# Patient Record
Sex: Female | Born: 1980
Health system: Southern US, Community
[De-identification: ages and names within clinical notes are randomized; demographics above are authoritative.]

## PROBLEM LIST (undated history)

## (undated) DIAGNOSIS — G4700 Insomnia, unspecified: Secondary | ICD-10-CM

## (undated) DIAGNOSIS — E559 Vitamin D deficiency, unspecified: Secondary | ICD-10-CM

## (undated) DIAGNOSIS — N3010 Interstitial cystitis (chronic) without hematuria: Secondary | ICD-10-CM

## (undated) DIAGNOSIS — G43909 Migraine, unspecified, not intractable, without status migrainosus: Secondary | ICD-10-CM

## (undated) DIAGNOSIS — N8003 Adenomyosis of the uterus: Secondary | ICD-10-CM

## (undated) DIAGNOSIS — E039 Hypothyroidism, unspecified: Secondary | ICD-10-CM

## (undated) DIAGNOSIS — N800 Endometriosis of uterus: Secondary | ICD-10-CM

## (undated) HISTORY — PX: CERVICAL CONE BIOPSY: SUR198

## (undated) HISTORY — DX: Endometriosis of uterus: N80.0

## (undated) HISTORY — DX: Migraine, unspecified, not intractable, without status migrainosus: G43.909

## (undated) HISTORY — DX: Hypothyroidism, unspecified: E03.9

## (undated) HISTORY — DX: Adenomyosis of the uterus: N80.03

## (undated) HISTORY — DX: Interstitial cystitis (chronic) without hematuria: N30.10

## (undated) HISTORY — DX: Vitamin D deficiency, unspecified: E55.9

## (undated) HISTORY — DX: Insomnia, unspecified: G47.00

---

## 2005-11-23 ENCOUNTER — Other Ambulatory Visit: Admission: RE | Admit: 2005-11-23 | Discharge: 2005-11-23 | Payer: Self-pay | Admitting: Gynecology

## 2016-12-12 ENCOUNTER — Encounter: Payer: Self-pay | Admitting: *Deleted

## 2016-12-13 ENCOUNTER — Encounter: Payer: Self-pay | Admitting: Diagnostic Neuroimaging

## 2016-12-13 ENCOUNTER — Ambulatory Visit (INDEPENDENT_AMBULATORY_CARE_PROVIDER_SITE_OTHER): Payer: Commercial Managed Care - PPO | Admitting: Diagnostic Neuroimaging

## 2016-12-13 ENCOUNTER — Encounter (INDEPENDENT_AMBULATORY_CARE_PROVIDER_SITE_OTHER): Payer: Self-pay

## 2016-12-13 VITALS — BP 125/90 | HR 85 | Ht 65.0 in | Wt 219.8 lb

## 2016-12-13 DIAGNOSIS — G43009 Migraine without aura, not intractable, without status migrainosus: Secondary | ICD-10-CM | POA: Diagnosis not present

## 2016-12-13 DIAGNOSIS — G252 Other specified forms of tremor: Secondary | ICD-10-CM | POA: Diagnosis not present

## 2016-12-13 DIAGNOSIS — G4440 Drug-induced headache, not elsewhere classified, not intractable: Secondary | ICD-10-CM | POA: Diagnosis not present

## 2016-12-13 DIAGNOSIS — R51 Headache: Secondary | ICD-10-CM | POA: Diagnosis not present

## 2016-12-13 DIAGNOSIS — T3995XA Adverse effect of unspecified nonopioid analgesic, antipyretic and antirheumatic, initial encounter: Secondary | ICD-10-CM

## 2016-12-13 DIAGNOSIS — R519 Headache, unspecified: Secondary | ICD-10-CM

## 2016-12-13 DIAGNOSIS — H538 Other visual disturbances: Secondary | ICD-10-CM

## 2016-12-13 MED ORDER — AMITRIPTYLINE HCL 25 MG PO TABS: 25.0000 mg | ORAL_TABLET | Freq: Every day | ORAL | 6 refills | Status: DC

## 2016-12-13 NOTE — Patient Instructions (Signed)
Thank you for coming to see [place] at [facility]. I hope we have been able to provide you high quality care today.  You may receive a patient satisfaction survey over the next few weeks. We would appreciate your feedback and comments so that we may continue to improve ourselves and the health of our patients.  - check MRI brain  - may consider lumbar puncture in future to measure opening pressure  - start amitriptyline '25mg'$  at bedtime  - continue maxalt, tylenol as needed   To prevent or relieve headaches, try the following:   Cool Compress. Lie down and place a cool compress on your head.   Avoid headache triggers. If certain foods or odors seem to have triggered your migraines in the past, avoid them. A headache diary might help you identify triggers.   Include physical activity in your daily routine.   Manage stress. Find healthy ways to cope with the stressors, such as delegating tasks on your to-do list.   Practice relaxation techniques. Try deep breathing, yoga, massage and visualization.   Eat regularly. Eating regularly scheduled meals and maintaining a healthy diet might help prevent headaches. Also, drink plenty of fluids.   Follow a regular sleep schedule. Sleep deprivation might contribute to headaches  Consider biofeedback. With this mind-body technique, you learn to control certain bodily functions - such as muscle tension, heart rate and blood pressure - to prevent headaches or reduce headache pain.    ~~~~~~~~~~~~~~~~~~~~~~~~~~~~~~~~~~~~~~~~~~~~~~~~~~~~~~~~~~~~~~~~~  DR. Destinie Thornsberry'S GUIDE TO HAPPY AND HEALTHY LIVING These are some of my general health and wellness recommendations. Some of them may apply to you better than others. Please use common sense as you try these suggestions and feel free to ask me any questions.   ACTIVITY/FITNESS Mental, social, emotional and physical stimulation are very important for brain and body health. Try  learning a new activity (arts, music, language, sports, games).  Keep moving your body to the best of your abilities. You can do this at home, inside or outside, the park, community center, gym or anywhere you like. Consider a physical therapist or personal trainer to get started. Consider the app Sworkit. Fitness trackers such as smart-watches, smart-phones or Fitbits can help as well.   NUTRITION Eat more plants: colorful vegetables, nuts, seeds and berries.  Eat less sugar, salt, preservatives and processed foods.  Avoid toxins such as cigarettes and alcohol.  Drink water when you are thirsty. Warm water with a slice of lemon is an excellent morning drink to start the day.  Consider these websites for more information The Nutrition Source (https://www.henry-hernandez.biz/) Precision Nutrition (WindowBlog.ch)   RELAXATION Consider practicing mindfulness meditation or other relaxation techniques such as deep breathing, prayer, yoga, tai chi, massage. See website mindful.org or the apps Headspace or Calm to help get started.   SLEEP Try to get at least 7-8+ hours sleep per day. Regular exercise and reduced caffeine will help you sleep better. Practice good sleep hygeine techniques. See website sleep.org for more information.   PLANNING Prepare estate planning, living will, healthcare POA documents. Sometimes this is best planned with the help of an attorney. Theconversationproject.org and agingwithdignity.org are excellent resources.

## 2016-12-13 NOTE — Progress Notes (Signed)
[facility]  PATIENT: Lauren Faulkner DOB: 1981-05-24  REFERRING CLINICIAN: Karrie Doffing, PA HISTORY FROM: patient  REASON FOR VISIT: new consult    HISTORICAL  CHIEF COMPLAINT:  Chief Complaint  Patient presents with  . NP  Lauren Faulkner  . Migraines    has vision changes, hand tremors/ lightheadedness goes away after 15 min.   glucose ok.    HISTORY OF PRESENT ILLNESS:   36 year old right-handed file here for evaluation of headaches, vision changes, tremors. Patient has history of migraine headaches since 36 years old. She describes right-sided pressure and pounding headaches with nausea, photophobia, phonophobia. Headaches improved around 2008, but worsened in 2015. No specific etiology for change in headache frequency or pattern. Triggering factors include process food and alcohol. Menstrual cycle seems to aggravate headaches. Patient has at least 3 days of menstrual migraine per month. She has 1-2 other migraine headaches per week. She also has mild daily headache. She uses Tylenol almost on daily basis. She is Maxalt as needed for rescue of migraine. She is tried Fioricet, Imitrex, Topamax without benefit.  In last 6 month patient having increasing changes with her vision. Last 2-4 months she has had fine tremor in her arms and hands.    REVIEW OF SYSTEMS: Full 14 system review of systems performed and negative with exception of: Blurred vision headache numbness dizziness tremor.  ALLERGIES: No Known Allergies  HOME MEDICATIONS: Outpatient Medications Prior to Visit  Medication Sig Dispense Refill  . ALPRAZolam (XANAX) 0.25 MG tablet Take 0.25 mg by mouth at bedtime as needed for anxiety.    . rizatriptan (MAXALT) 10 MG tablet Take 10 mg by mouth as needed for migraine. May repeat in 2 hours if needed    . levothyroxine (SYNTHROID, LEVOTHROID) 25 MCG tablet Take 25 mcg by mouth daily before breakfast.    . loratadine (CLARITIN) 10 MG tablet Take 10 mg by mouth  daily.    Lauren Faulkner Triphasic (ORTHO TRI-CYCLEN, 28, PO) Take 1 tablet by mouth daily.     No facility-administered medications prior to visit.     PAST MEDICAL HISTORY: Past Medical History:  Diagnosis Date  . Adenomyosis    fibroadenomoa R breast  . Hypothyroid   . Insomnia   . Interstitial cystitis   . Migraines   . Vitamin D deficiency     PAST SURGICAL HISTORY: Past Surgical History:  Procedure Laterality Date  . CERVICAL CONE BIOPSY      FAMILY HISTORY: Family History  Problem Relation Age of Onset  . Diabetes Mother   . Hyperlipidemia Mother   . Fibroids Mother   . Diabetes Father   . Hypertension Father   . Hyperlipidemia Father   . Ovarian cancer Maternal Grandmother   . Stroke Maternal Grandmother     SOCIAL HISTORY:  Social History   Social History  . Marital status: Married    Spouse name: N/A  . Number of children: N/A  . Years of education: N/A   Occupational History  . Not on file.   Social History Main Topics  . Smoking status: Former Smoker    Quit date: 06/05/2002  . Smokeless tobacco: Never Used  . Alcohol use Yes     Comment: occ  . Drug use: No  . Sexual activity: Not on file   Other Topics Concern  . Not on file   Social History Narrative   Pt is Engineer, civil (consulting), employed at [employer].  Married.  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  Caffeine  1 cup per day.       PHYSICAL EXAM  GENERAL EXAM/CONSTITUTIONAL: Vitals:  Vitals:   12/13/16 0845  BP: 125/90  Pulse: 85  Weight: 219 lb 12.8 oz (99.7 kg)  Height: 5\' 5"  (1.651 m)     Body mass index is 36.58 kg/m.  Visual Acuity Screening   Right eye Left eye Both eyes  Without correction: 20/40    With correction:  20/40      Patient is in no distress; well developed, nourished and groomed; neck is supple  CARDIOVASCULAR:  Examination of carotid arteries is normal; no carotid bruits  Regular rate and rhythm, no murmurs  Examination of peripheral vascular system by  observation and palpation is normal  EYES:  Ophthalmoscopic exam of optic discs and posterior segments is normal; no papilledema or hemorrhages  MUSCULOSKELETAL:  Gait, strength, tone, movements noted in Neurologic exam below  NEUROLOGIC: MENTAL STATUS:  No flowsheet data found.  awake, alert, oriented to person, place and time  recent and remote memory intact  normal attention and concentration  language fluent, comprehension intact, naming intact,   fund of knowledge appropriate  CRANIAL NERVE:   2nd - no papilledema on fundoscopic exam  2nd, 3rd, 4th, 6th - pupils equal and reactive to light, visual fields full to confrontation, extraocular muscles intact, no nystagmus  5th - facial sensation symmetric  7th - facial strength symmetric  8th - hearing intact  9th - palate elevates symmetrically, uvula midline  11th - shoulder shrug symmetric  12th - tongue protrusion midline  MOTOR:   FINE POSTURAL TREMOR IN BUE  normal bulk and tone, full strength in the BUE, BLE  SENSORY:   normal and symmetric to light touch, temperature, vibration  COORDINATION:   finger-nose-finger, fine finger movements normal  REFLEXES:   deep tendon reflexes present and symmetric  GAIT/STATION:   narrow based gait; able to walk tandem; romberg is negative    DIAGNOSTIC DATA (LABS, IMAGING, TESTING) - I reviewed patient records, labs, notes, testing and imaging myself where available.  No results found for: WBC, HGB, HCT, MCV, PLT No results found for: NA, K, CL, CO2, GLUCOSE, BUN, CREATININE, CALCIUM, PROT, ALBUMIN, AST, ALT, ALKPHOS, BILITOT, GFRNONAA, GFRAA No results found for: CHOL, HDL, LDLCALC, LDLDIRECT, TRIG, CHOLHDL No results found for: ZOXW9U No results found for: VITAMINB12 No results found for: TSH       ASSESSMENT AND PLAN  36 y.o. year old female here with history of migraine without aura since age 28 years old, with increasing headaches in the  past 3 years. Now with new onset vision changes and tremor. Will check additional workup and optimize migraine treatment.  Meds tried: tylenol (partially effective), maxalt (partially effective), fioricet (lightheaded), imitrex (lightheaded), topamax (lightheaded)   Dx:  1. Migraine without aura and without status migrainosus, not intractable   2. Chronic daily headache   3. Analgesic overuse headache   4. Postural tremor   5. Blurred vision       PLAN: - check MRI brain (w/wo) - rule out autoimmune, inflam, vascular, demyelinating dz - may consider lumbar puncture in future to measure opening pressure - start amitriptyline 25mg  at bedtime - continue maxalt, tylenol as needed  Orders Placed This Encounter  Procedures  . MR BRAIN W WO CONTRAST   Meds ordered this encounter  Medications  . amitriptyline (ELAVIL) 25 MG tablet    Sig: Take 1 tablet (25 mg total) by mouth at bedtime.  Dispense:  30 tablet    Refill:  6   Return in about 3 months (around 03/15/2017).    Suanne MarkerVIKRAM R. Tyrel Lex, MD 12/13/2016, 9:20 AM Certified in Neurology, Neurophysiology and Neuroimaging  [facility] [address], [address] [place], [place] [address] [phone]

## 2017-03-12 ENCOUNTER — Encounter: Payer: Self-pay | Admitting: Diagnostic Neuroimaging

## 2017-03-28 ENCOUNTER — Ambulatory Visit: Payer: Commercial Managed Care - PPO | Admitting: Diagnostic Neuroimaging

## 2019-07-28 ENCOUNTER — Telehealth: Payer: Self-pay

## 2019-07-28 NOTE — Telephone Encounter (Signed)
Patient called and stated that she has finished her ABX and now another abcess has formed.  She would like to know if she could get another round of ABX and diflucan sent to [facility].   Please advise.

## 2019-07-29 ENCOUNTER — Other Ambulatory Visit: Payer: Self-pay | Admitting: Family Medicine

## 2019-07-29 MED ORDER — FLUCONAZOLE 150 MG PO TABS: 150.0000 mg | ORAL_TABLET | Freq: Once | ORAL | 0 refills | Status: AC

## 2019-07-29 MED ORDER — CEPHALEXIN 500 MG PO CAPS: 500.0000 mg | ORAL_CAPSULE | Freq: Two times a day (BID) | ORAL | 0 refills | Status: DC

## 2019-07-29 NOTE — Progress Notes (Signed)
I will send keflex and diflucan, but if does not resolve or if recurs she will need an appointment. kc

## 2019-10-17 ENCOUNTER — Ambulatory Visit (INDEPENDENT_AMBULATORY_CARE_PROVIDER_SITE_OTHER): Payer: Managed Care, Other (non HMO) | Admitting: Family Medicine

## 2019-10-17 DIAGNOSIS — J301 Allergic rhinitis due to pollen: Secondary | ICD-10-CM

## 2019-10-17 NOTE — Progress Notes (Signed)
Not seen

## 2020-02-06 ENCOUNTER — Ambulatory Visit: Payer: Managed Care, Other (non HMO) | Admitting: Family Medicine

## 2020-02-06 ENCOUNTER — Other Ambulatory Visit: Payer: Self-pay

## 2020-02-06 ENCOUNTER — Encounter: Payer: Self-pay | Admitting: Family Medicine

## 2020-02-06 VITALS — BP 136/76 | HR 98 | Temp 97.8°F | Ht 67.0 in | Wt 247.0 lb

## 2020-02-06 DIAGNOSIS — F331 Major depressive disorder, recurrent, moderate: Secondary | ICD-10-CM

## 2020-02-06 MED ORDER — ESCITALOPRAM OXALATE 10 MG PO TABS: 10.0000 mg | ORAL_TABLET | Freq: Every day | ORAL | 0 refills | Status: DC

## 2020-02-06 NOTE — Patient Instructions (Signed)
Start on lexapro 10 mg once daily at night  Major Depressive Disorder, Adult Major depressive disorder (MDD) is a mental health condition. MDD often makes you feel sad, hopeless, or helpless. MDD can also cause symptoms in your body. MDD can affect your:  Work.  School.  Relationships.  Other normal activities. MDD can range from mild to very bad. It may occur once (single episode MDD). It can also occur many times (recurrent MDD). The main symptoms of MDD often include:  Feeling sad, depressed, or irritable most of the time.  Loss of interest. MDD symptoms also include:  Sleeping too much or too little.  Eating too much or too little.  A change in your weight.  Feeling tired (fatigue) or having low energy.  Feeling worthless.  Feeling guilty.  Trouble making decisions.  Trouble thinking clearly.  Thoughts of suicide or harming others.  Feeling weak.  Feeling agitated.  Keeping yourself from being around other people (isolation). Follow these instructions at home: Activity  Do these things as told by your doctor: ? Go back to your normal activities. ? Exercise regularly. ? Spend time outdoors. Alcohol  Talk with your doctor about how alcohol can affect your antidepressant medicines.  Do not drink alcohol. Or, limit how much alcohol you drink. ? This means no more than 1 drink a day for nonpregnant women and 2 drinks a day for men. One drink equals one of these:  12 oz of beer.  5 oz of wine.  1 oz of hard liquor. General instructions  Take over-the-counter and prescription medicines only as told by your doctor.  Eat a healthy diet.  Get plenty of sleep.  Find activities that you enjoy. Make time to do them.  Think about joining a support group. Your doctor may be able to suggest a group for you.  Keep all follow-up visits as told by your doctor. This is important. Where to find more information:  The [employer] on Mental  Illness: ? www.nami.org  [place]. [employer] of Mental Health: ? http://www.maynard.net/  National Suicide Prevention Lifeline: ? 512 102 5959. This is free, 24-hour help. Contact a doctor if:  Your symptoms get worse.  You have new symptoms. Get help right away if:  You self-harm.  You see, hear, taste, smell, or feel things that are not present (hallucinate). If you ever feel like you may hurt yourself or others, or have thoughts about taking your own life, get help right away. You can go to your nearest emergency department or call:  Your local emergency services (911 in the [place]  A suicide crisis helpline, such as the National Suicide Prevention Lifeline: ? 725-547-9209. This is open 24 hours a day. This information is not intended to replace advice given to you by your health care provider. Make sure you discuss any questions you have with your health care provider. Document Revised: 05/04/2017 Document Reviewed: 02/06/2016 Elsevier Patient Education  2020 [employer].

## 2020-02-06 NOTE — Progress Notes (Signed)
Acute Office Visit  Subjective:    Patient ID: Lauren Faulkner, female    DOB: March 06, 1981, 39 y.o.   MRN: [id number]  Chief Complaint  Patient presents with  . Anxiety   HPI Patient is in today for stress and anxiety. One child just graduated high school, one starting high school, and a 37 yr old along with starting a new job. Live with boyfriend and father of 3rd child. Boyfriend has bipolar. Patient feels safe at home. Returned to [facility] in ED.  Previously took lexapro which made her sleepy. PHQ9 14. Denies suicidal ideation.  Past Medical History:  Diagnosis Date  . Adenomyosis    fibroadenomoa R breast  . Hypothyroid   . Insomnia   . Interstitial cystitis   . Migraines   . Vitamin D deficiency       Family History  Problem Relation Age of Onset  . Diabetes Mother   . Hyperlipidemia Mother   . Fibroids Mother   . Diabetes Father   . Hypertension Father   . Hyperlipidemia Father   . Ovarian cancer Maternal Grandmother   . Stroke Maternal Grandmother     Social History   Socioeconomic History  . Marital status: Married    Spouse name: Not on file  . Number of children: Not on file  . Years of education: Not on file  . Highest education level: Not on file  Occupational History  . Not on file  Tobacco Use  . Smoking status: Former Smoker    Quit date: 06/05/2002    Years since quitting: 17.6  . Smokeless tobacco: Never Used  Substance and Sexual Activity  . Alcohol use: Yes    Comment: occ  . Drug use: No  . Sexual activity: Not on file  Other Topics Concern  . 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   Caffeine  1 cup per day.     Social Determinants of Health   Financial Resource Strain:   . Difficulty of Paying Living Expenses: Not on file  Food Insecurity:   . Worried About Charity fundraiser in the Last Year: Not on file  . Ran Out of Food in the Last Year: Not on  file  Transportation Needs:   . Lack of Transportation (Medical): Not on file  . Lack of Transportation (Non-Medical): Not on file  Physical Activity:   . Days of Exercise per Week: Not on file  . Minutes of Exercise per Session: Not on file  Stress:   . Feeling of Stress : Not on file  Social Connections:   . Frequency of Communication with Friends and Family: Not on file  . Frequency of Social Gatherings with Friends and Family: Not on file  . Attends Religious Services: Not on file  . Active Member of Clubs or Organizations: Not on file  . Attends Archivist Meetings: Not on file  . Marital Status: Not on file  Intimate Partner Violence:   . Fear of Current or Ex-Partner: Not on file  . Emotionally Abused: Not on file  . Physically Abused: Not on file  . Sexually Abused: Not on file    Outpatient Medications Prior to Visit  Medication Sig Dispense Refill  . Multiple Vitamin (MULTIVITAMIN) tablet Take 1 tablet by mouth daily.    . cholecalciferol (VITAMIN D) 1000 units tablet Take 1,000 Units by mouth daily.    Marland Kitchen  rizatriptan (MAXALT) 10 MG tablet Take 10 mg by mouth as needed for migraine. May repeat in 2 hours if needed    . ALPRAZolam (XANAX) 0.25 MG tablet Take 0.25 mg by mouth at bedtime as needed for anxiety.    Marland Kitchen amitriptyline (ELAVIL) 25 MG tablet Take 1 tablet (25 mg total) by mouth at bedtime. 30 tablet 6  . cephALEXin (KEFLEX) 500 MG capsule Take 1 capsule (500 mg total) by mouth 2 (two) times daily. 20 capsule 0   No facility-administered medications prior to visit.    No Known Allergies  Review of Systems  Constitutional: Negative for chills, fatigue and fever.  HENT: Negative for congestion, ear pain and sore throat.   Respiratory: Negative for cough and shortness of breath.   Cardiovascular: Negative for chest pain.  Gastrointestinal: Negative for abdominal pain, constipation, diarrhea, nausea and vomiting.  Genitourinary: Negative for dysuria and  urgency.  Musculoskeletal: Negative for arthralgias and myalgias.  Skin: Negative for rash.  Neurological: Negative for dizziness and headaches.  Psychiatric/Behavioral: Positive for dysphoric mood. The patient is not nervous/anxious.        Objective:    Physical Exam Vitals reviewed.  Constitutional:      Appearance: Normal appearance. She is normal weight.  Cardiovascular:     Rate and Rhythm: Normal rate and regular rhythm.     Pulses: Normal pulses.     Heart sounds: Normal heart sounds.  Pulmonary:     Effort: Pulmonary effort is normal. No respiratory distress.     Breath sounds: Normal breath sounds.  Abdominal:     General: Abdomen is flat. Bowel sounds are normal.     Palpations: Abdomen is soft.     Tenderness: There is no abdominal tenderness.  Neurological:     Mental Status: She is alert and oriented to person, place, and time.  Psychiatric:        Mood and Affect: Mood normal.        Behavior: Behavior normal.     BP 136/76   Pulse 98   Temp 97.8 F (36.6 C)   Ht _0  (1.702 m)   Wt 247 lb (112 kg)   SpO2 97%   BMI 38.69 kg/m  Wt Readings from Last 3 Encounters:  02/06/20 247 lb (112 kg)  12/13/16 219 lb 12.8 oz (99.7 kg)    Health Maintenance Due  Topic Date Due  . Hepatitis C Screening  Never done  . COVID-19 Vaccine (1) Never done  . HIV Screening  Never done  . PAP SMEAR-Modifier  Never done  . INFLUENZA VACCINE  Never done    There are no preventive care reminders to display for this patient.   No results found for: TSH No results found for: WBC, HGB, HCT, MCV, PLT No results found for: NA, K, CHLORIDE, CO2, GLUCOSE, BUN, CREATININE, BILITOT, ALKPHOS, AST, ALT, PROT, ALBUMIN, CALCIUM, ANIONGAP, EGFR, GFR No results found for: CHOL No results found for: HDL No results found for: LDLCALC No results found for: TRIG No results found for: CHOLHDL No results found for: HGBA1C       Assessment & Plan:  1. Moderate recurrent major  depression (HCC) Start lexapro 10 mg once daily. Recommended counseling.  Recommended rest, exercise, and healthy eating.   Follow up in 3-4 weeks.    Rochel Brome, MD

## 2020-02-26 ENCOUNTER — Telehealth (INDEPENDENT_AMBULATORY_CARE_PROVIDER_SITE_OTHER): Payer: Managed Care, Other (non HMO) | Admitting: Family Medicine

## 2020-02-26 ENCOUNTER — Encounter: Payer: Self-pay | Admitting: Family Medicine

## 2020-02-26 VITALS — Wt 240.0 lb

## 2020-02-26 DIAGNOSIS — Z6837 Body mass index (BMI) 37.0-37.9, adult: Secondary | ICD-10-CM | POA: Diagnosis not present

## 2020-02-26 DIAGNOSIS — F330 Major depressive disorder, recurrent, mild: Secondary | ICD-10-CM | POA: Insufficient documentation

## 2020-02-26 DIAGNOSIS — E6609 Other obesity due to excess calories: Secondary | ICD-10-CM

## 2020-02-26 MED ORDER — ESCITALOPRAM OXALATE 10 MG PO TABS: 10.0000 mg | ORAL_TABLET | Freq: Every day | ORAL | 1 refills | Status: DC

## 2020-02-26 NOTE — Progress Notes (Signed)
Virtual Visit via Telephone Note   This visit type was conducted due to national recommendations for restrictions regarding the COVID-19 Pandemic (e.g. social distancing) in an effort to limit this patient's exposure and mitigate transmission in our community.  Due to her co-morbid illnesses, this patient is at least at moderate risk for complications without adequate follow up.  This format is felt to be most appropriate for this patient at this time.  The patient did not have access to video technology/had technical difficulties with video requiring transitioning to audio format only (telephone).  All issues noted in this document were discussed and addressed.  No physical exam could be performed with this format.  Patient verbally consented to a telehealth visit.   Date:  02/26/2020   ID:  Lauren Faulkner, DOB 09-Nov-1980, MRN [id number]  Patient Location: Home Provider Location: Office/Clinic  PCP:  Blane Ohara, MD   Evaluation Performed:  Follow-Up Visit  Chief Complaint:  DEPRESSION.  History of Present Illness:    Patient is in today for follow-up of depression and anxiety.  Previously patient had taken Lexapro and it made her sleepy however she was willing to try it again.  I restarted her at 10 mg once daily.  She feels is working well for her anxiety.  She denies depression or anhedonia.  Denies sleep issues.  Patient is working on eating healthier.  Denies suicidal ideation.  She is under a lot of stress have one child that just graduated high school, one just starting high school, and one a toddler with her  Boyfriend, who is bipolar.  She is also just returned to work.  She is coping well with these changes however since starting the Lexapro.. Patient states her current medication regimen is working and would like to continue it.  The patient does not have symptoms concerning for COVID-19 infection (fever, chills, cough, or new shortness of breath).    Past Medical History:    Diagnosis Date  . Adenomyosis    fibroadenomoa R breast  . Hypothyroid   . Insomnia   . Interstitial cystitis   . Migraines   . Vitamin D deficiency     Past Surgical History:  Procedure Laterality Date  . CERVICAL CONE BIOPSY      Family History  Problem Relation Age of Onset  . Diabetes Mother   . Hyperlipidemia Mother   . Fibroids Mother   . Diabetes Father   . Hypertension Father   . Hyperlipidemia Father   . Ovarian cancer Maternal Grandmother   . Stroke Maternal Grandmother     Social History   Socioeconomic History  . Marital status: Married    Spouse name: Not on file  . Number of children: Not on file  . Years of education: Not on file  . Highest education level: Not on file  Occupational History  . Not on file  Tobacco Use  . Smoking status: Former Smoker    Quit date: 06/05/2002    Years since quitting: 17.7  . Smokeless tobacco: Never Used  Substance and Sexual Activity  . Alcohol use: Yes    Comment: occ  . Drug use: No  . Sexual activity: Not on file  Other Topics Concern  . 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   Caffeine  1 cup per day.     Social Determinants of Health   Financial Resource Strain:   .  Difficulty of Paying Living Expenses: Not on file  Food Insecurity:   . Worried About Programme researcher, broadcasting/film/video in the Last Year: Not on file  . Ran Out of Food in the Last Year: Not on file  Transportation Needs:   . Lack of Transportation (Medical): Not on file  . Lack of Transportation (Non-Medical): Not on file  Physical Activity:   . Days of Exercise per Week: Not on file  . Minutes of Exercise per Session: Not on file  Stress:   . Feeling of Stress : Not on file  Social Connections:   . Frequency of Communication with Friends and Family: Not on file  . Frequency of Social Gatherings with Friends and Family: Not on file  . Attends Religious Services: Not on file   . Active Member of Clubs or Organizations: Not on file  . Attends Banker Meetings: Not on file  . Marital Status: Not on file  Intimate Partner Violence:   . Fear of Current or Ex-Partner: Not on file  . Emotionally Abused: Not on file  . Physically Abused: Not on file  . Sexually Abused: Not on file    Outpatient Medications Prior to Visit  Medication Sig Dispense Refill  . cholecalciferol (VITAMIN D) 1000 units tablet Take 1,000 Units by mouth daily.    Marland Kitchen escitalopram (LEXAPRO) 10 MG tablet Take 1 tablet (10 mg total) by mouth daily. 30 tablet 0  . Multiple Vitamin (MULTIVITAMIN) tablet Take 1 tablet by mouth daily.    . rizatriptan (MAXALT) 10 MG tablet Take 10 mg by mouth as needed for migraine. May repeat in 2 hours if needed     No facility-administered medications prior to visit.   .med Allergies:   Patient has no known allergies.   Social History   Tobacco Use  . Smoking status: Former Smoker    Quit date: 06/05/2002    Years since quitting: 17.7  . Smokeless tobacco: Never Used  Substance Use Topics  . Alcohol use: Yes    Comment: occ  . Drug use: No     Review of Systems  Constitutional: Negative for chills, fever and malaise/fatigue.  HENT: Negative for ear pain, sinus pain and sore throat.   Respiratory: Negative for cough and shortness of breath.   Cardiovascular: Negative for chest pain.  Musculoskeletal: Negative for myalgias.  Neurological: Negative for headaches.  Psychiatric/Behavioral: The patient is not nervous/anxious (Has helped).     Labs/Other Tests and Data Reviewed:    Recent Labs: No results found for requested labs within last 8760 hours.   Recent Lipid Panel No results found for: CHOL, TRIG, HDL, CHOLHDL, LDLCALC, LDLDIRECT  Wt Readings from Last 3 Encounters:  02/06/20 247 lb (112 kg)  12/13/16 219 lb 12.8 oz (99.7 kg)     Objective:    Vital Signs:  There were no vitals taken for this visit.   Physical Exam    ASSESSMENT & PLAN:   1. Mild recurrent major depression (HCC) The current medical regimen is effective;  continue present plan and medications. Rx. lexapro sent.  2. Obesity. bmi 37. Encouraged diet and exercise.    COVID-19 Education: The signs and symptoms of COVID-19 were discussed with the patient and how to seek care for testing (follow up with PCP or arrange E-visit). The importance of social distancing was discussed today.  Time:   Today, I have spent 10 minutes with the patient with telehealth technology discussing the above problems.  Follow Up:  In Person in 6 month(s)  Signed, Blane Ohara, MD  02/26/2020 1:53 PM    [facility]

## 2020-03-12 ENCOUNTER — Other Ambulatory Visit: Payer: Self-pay | Admitting: Family Medicine

## 2020-04-16 ENCOUNTER — Other Ambulatory Visit: Payer: Self-pay | Admitting: Family Medicine

## 2020-04-28 ENCOUNTER — Other Ambulatory Visit: Payer: Self-pay

## 2020-04-28 MED ORDER — MEDROXYPROGESTERONE ACETATE 150 MG/ML IM SUSY
PREFILLED_SYRINGE | INTRAMUSCULAR | 0 refills | Status: DC
Start: 2020-04-28 — End: 2020-09-13

## 2020-06-20 ENCOUNTER — Telehealth: Payer: Self-pay

## 2020-06-20 NOTE — Telephone Encounter (Signed)
LM to RS apt. I asked for the pt to call the office back. Office will be closed on Monday January 17 due to inclement weather. 

## 2020-06-21 ENCOUNTER — Encounter: Payer: Managed Care, Other (non HMO) | Admitting: Family Medicine

## 2020-07-08 ENCOUNTER — Encounter: Payer: Self-pay | Admitting: Nurse Practitioner

## 2020-07-08 ENCOUNTER — Ambulatory Visit (INDEPENDENT_AMBULATORY_CARE_PROVIDER_SITE_OTHER): Payer: No Typology Code available for payment source | Admitting: Nurse Practitioner

## 2020-07-08 ENCOUNTER — Other Ambulatory Visit: Payer: Self-pay

## 2020-07-08 VITALS — BP 128/72 | HR 102 | Temp 98.2°F | Ht 67.0 in | Wt 248.0 lb

## 2020-07-08 DIAGNOSIS — R5383 Other fatigue: Secondary | ICD-10-CM

## 2020-07-08 DIAGNOSIS — Z0000 Encounter for general adult medical examination without abnormal findings: Secondary | ICD-10-CM

## 2020-07-08 DIAGNOSIS — Z6838 Body mass index (BMI) 38.0-38.9, adult: Secondary | ICD-10-CM | POA: Diagnosis not present

## 2020-07-08 NOTE — Progress Notes (Signed)
Subjective:  Patient ID: Lauren Faulkner, female    DOB: [birth year]  Age: 40 y.o. MRN: [id number]  Chief Complaint  Patient presents with  . Annual Exam    With pap smear    HPI Well Adult Physical: Patient here for a comprehensive physical exam.The patient reports adult attention deficit disorder. Lauren Faulkner states she was diagnosed as a child and treated with prescription medication until early adulthood. She tells me she developed strategies to cope with inattentiveness. She states she was managing well until she had an unexpected pregnancy in 2020. 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 She states she has experienced difficulty concentrating or remembering appointments. Lauren Faulkner states she forgot her health insurance card today and is not fasting per instructions. She states she has struggled at work and at home completing tasks. Screening for adult ADHD (ASRS-vi.i) is positive. Education provided for medication management.  Do you take any herbs or supplements that were not prescribed by a doctor? no Are you taking calcium supplements? no Are you taking aspirin daily? no  Encounter for general adult medical examination without abnormal findings  Physical ("At Risk" items are starred): Patient's last physical exam was over 1 year ago .  Smoking: Life-long non-smoker ;  Physical Activity: She is not currently exercising Alcohol/Drug Use: Is a non-drinker ; No illicit drug use ;  Patient is not afflicted from Stress Incontinence and Urge Incontinence  Safety: reviewed. Patient wears a seat belt, has smoke detectors, has carbon monoxide detectors, practices appropriate gun safety, and wears sunscreen with extended sun exposure. Dental Care: biannual cleanings, brushes and flosses daily. Ophthalmology/Optometry:none  Hearing loss: none Vision impairments: none  Menarche: 40 years old Menstrual History:  Irregular LMP: Unknown, over 2 years Pregnancy history: 3 pregnancies and 3 vaginal births Safe at home: Yes Self breast exams: No     Flowsheet Row Video Visit from 02/26/2020 in [facility]  PHQ-2 Total Score 0              Social Hx   Social History   Socioeconomic History  . Marital status: Married    Spouse name: Not on file  . Number of children: Not on file  . Years of education: Not on file  . Highest education level: Not on file  Occupational History  . Not on file  Tobacco Use  . Smoking status: Former Smoker    Quit date: 06/05/2002    Years since quitting: 18.1  . Smokeless tobacco: Never Used  Substance and Sexual Activity  . Alcohol use: Yes    Comment: occ  . Drug use: No  . Sexual activity: Not on file  Other Topics Concern  . 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   Caffeine  1 cup per day.     Social Determinants of Health   Financial Resource Strain: Not on file  Food Insecurity: Not on file  Transportation Needs: Not on file  Physical Activity: Not on file  Stress: Not on file  Social Connections: Not on file   Past Medical History:  Diagnosis Date  . Adenomyosis    fibroadenomoa R breast  . Hypothyroid   . Insomnia   . Interstitial cystitis   . Migraines   . Vitamin D deficiency    Past Surgical History:  Procedure  Laterality Date  . CERVICAL CONE BIOPSY      Family History  Problem Relation Age of Onset  . Diabetes Mother   . Hyperlipidemia Mother   . Fibroids Mother   . Diabetes Father   . Hypertension Father   . Hyperlipidemia Father   . Ovarian cancer Maternal Grandmother   . Stroke Maternal Grandmother     Review of Systems  Constitutional: Negative for fatigue and fever.  HENT: Negative for congestion, ear pain, sinus pressure and sore throat.   Eyes: Negative for pain.  Respiratory: Negative for cough, chest tightness, shortness of  breath and wheezing.   Cardiovascular: Negative for chest pain and palpitations.  Gastrointestinal: Negative for abdominal pain, constipation, diarrhea, nausea and vomiting.  Genitourinary: Negative for dysuria and hematuria.  Musculoskeletal: Negative for arthralgias, back pain, joint swelling and myalgias.  Skin: Negative for rash.  Neurological: Negative for dizziness, weakness and headaches.  Psychiatric/Behavioral: Negative for dysphoric mood. The patient is not nervous/anxious.      Objective:  BP 128/72 (BP Location: Left Arm, Patient Position: Sitting)   Pulse (!) 102   Temp 98.2 F (36.8 C) (Temporal)   Ht 5\' 7"  (1.702 m)   Wt 248 lb (112.5 kg)   SpO2 99%   BMI 38.84 kg/m   BP/Weight 07/08/2020 02/26/2020 02/06/2020  Systolic BP 128 - 136  Diastolic BP 72 - 76  Wt. (Lbs) 248 240 247  BMI 38.84 37.59 38.69    Physical Exam Constitutional:      Appearance: Normal appearance.  HENT:     Right Ear: Tympanic membrane, ear canal and external ear normal.     Left Ear: Tympanic membrane, ear canal and external ear normal.     Nose: Nose normal.     Mouth/Throat:     Mouth: Mucous membranes are moist.  Cardiovascular:     Rate and Rhythm: Normal rate and regular rhythm.     Pulses: Normal pulses.     Heart sounds: Normal heart sounds.  Pulmonary:     Effort: Pulmonary effort is normal.     Breath sounds: Normal breath sounds.  Abdominal:     Palpations: Abdomen is soft.  Musculoskeletal:        General: Normal range of motion.     Cervical back: Normal range of motion.  Skin:    General: Skin is warm and dry.  Neurological:     Mental Status: She is oriented to person, place, and time.  Psychiatric:        Mood and Affect: Mood normal.        Behavior: Behavior normal.        Thought Content: Thought content normal.        Judgment: Judgment normal.       Assessment & Plan:   1. Physical exam - CBC with Differential/Platelet - Comprehensive metabolic  panel - TSH; Future - IGP, Aptima HPV, rfx 16/18,45  2. Fatigue, unspecified type - CBC with Differential/Platelet - Comprehensive metabolic panel - VITAMIN D 25 Hydroxy (Vit-D Deficiency, Fractures); Future - Lipid panel - TSH; Future - TSH - VITAMIN D 25 Hydroxy (Vit-D Deficiency, Fractures)  3. BMI 38.0-38.9,adult - Lipid panel - TSH; Future   Return for fasting labs next week We will call you with pap results Return in 20-months-telemedicine       Body mass index is 38.84 kg/m.   These are the goals we discussed: Goals   Increase physical activity  This is a list of the screening recommended for you and due dates:  Health Maintenance  Topic Date Due  .  Hepatitis C: One time screening is recommended by Center for Disease Control  (CDC) for  adults born from [birth year] through 1963.   Never done  . HIV Screening  Never done  . Pap Smear  Never done  . Flu Shot  Never done  . COVID-19 Vaccine (3 - Booster for Pfizer series) 11/10/2020  . Tetanus Vaccine  08/07/2028     AN INDIVIDUALIZED CARE PLAN: was established or reinforced today.   SELF MANAGEMENT: The patient and I together assessed ways to personally work towards obtaining the recommended goals  Support needs The patient and/or family needs were assessed and services were offered if appropriate.    Follow-up: 47-months  An After Visit Summary was printed and given to the patient.  Janie Morning, NP [facility] [phone]

## 2020-07-08 NOTE — Patient Instructions (Addendum)
Return for fasting labs next week We will call you with pap results Return in 68-months-telemedicine   Pap Test Why am I having this test? A Pap test, also called a Pap smear, is a screening test to check for signs of:  Cancer of the vagina, cervix, and uterus. The cervix is the lower part of the uterus that opens into the vagina.  Infection.  Changes that may be a sign that cancer is developing (precancerous changes). Women need this test on a regular basis. In general, you should have a Pap test every 3 years until you reach menopause or age 20. Women aged 30-60 may choose to have their Pap test done at the same time as an HPV (human papillomavirus) test every 5 years (instead of every 3 years). Your health care provider may recommend having Pap tests more or less often depending on your medical conditions and past Pap test results. What kind of sample is taken? Your health care provider will collect a sample of cells from the surface of your cervix. This will be done using a small cotton swab, plastic spatula, or brush. This sample is often collected during a pelvic exam, when you are lying on your back on an exam table with feet in footrests (stirrups). In some cases, fluids (secretions) from the cervix or vagina may also be collected.   How do I prepare for this test?  Be aware of where you are in your menstrual cycle. If you are menstruating on the day of the test, you may be asked to reschedule.  You may need to reschedule if you have a known vaginal infection on the day of the test.  Follow instructions from your health care provider about: ? Changing or stopping your regular medicines. Some medicines can cause abnormal test results, such as digitalis and tetracycline. ? Avoiding douching or taking a bath the day before or the day of the test. Tell a health care provider about:  Any allergies you have.  All medicines you are taking, including vitamins, herbs, eye drops, creams,  and over-the-counter medicines.  Any blood disorders you have.  Any surgeries you have had.  Any medical conditions you have.  Whether you are pregnant or may be pregnant. How are the results reported? Your test results will be reported as either abnormal or normal. A false-positive result can occur. A false positive is incorrect because it means that a condition is present when it is not. A false-negative result can occur. A false negative is incorrect because it means that a condition is not present when it is. What do the results mean? A normal test result means that you do not have signs of cancer of the vagina, cervix, or uterus. An abnormal result may mean that you have:  Cancer. A Pap test by itself is not enough to diagnose cancer. You will have more tests done in this case.  Precancerous changes in your vagina, cervix, or uterus.  Inflammation of the cervix.  An STD (sexually transmitted disease).  A fungal infection.  A parasite infection. Talk with your health care provider about what your results mean. Questions to ask your health care provider Ask your health care provider, or the department that is doing the test:  When will my results be ready?  How will I get my results?  What are my treatment options?  What other tests do I need?  What are my next steps? Summary  In general, women should have a Pap  test every 3 years until they reach menopause or age 73.  Your health care provider will collect a sample of cells from the surface of your cervix. This will be done using a small cotton swab, plastic spatula, or brush.  In some cases, fluids (secretions) from the cervix or vagina may also be collected. This information is not intended to replace advice given to you by your health care provider. Make sure you discuss any questions you have with your health care provider. Document Revised: 01/28/2020 Document Reviewed: 01/23/2020 Elsevier Patient Education   2021 [employer] Inc.  Health Maintenance, Female Adopting a healthy lifestyle and getting preventive care are important in promoting health and wellness. Ask your health care provider about:  The right schedule for you to have regular tests and exams.  Things you can do on your own to prevent diseases and keep yourself healthy. What should I know about diet, weight, and exercise? Eat a healthy diet  Eat a diet that includes plenty of vegetables, fruits, low-fat dairy products, and lean protein.  Do not eat a lot of foods that are high in solid fats, added sugars, or sodium.   Maintain a healthy weight Body mass index (BMI) is used to identify weight problems. It estimates body fat based on height and weight. Your health care provider can help determine your BMI and help you achieve or maintain a healthy weight. Get regular exercise Get regular exercise. This is one of the most important things you can do for your health. Most adults should:  Exercise for at least 150 minutes each week. The exercise should increase your heart rate and make you sweat (moderate-intensity exercise).  Do strengthening exercises at least twice a week. This is in addition to the moderate-intensity exercise.  Spend less time sitting. Even light physical activity can be beneficial. Watch cholesterol and blood lipids Have your blood tested for lipids and cholesterol at 38 years of age, then have this test every 5 years. Have your cholesterol levels checked more often if:  Your lipid or cholesterol levels are high.  You are older than 38 years of age.  You are at high risk for heart disease. What should I know about cancer screening? Depending on your health history and family history, you may need to have cancer screening at various ages. This may include screening for:  Breast cancer.  Cervical cancer.  Colorectal cancer.  Skin cancer.  Lung cancer. What should I know about heart disease,  diabetes, and high blood pressure? Blood pressure and heart disease  High blood pressure causes heart disease and increases the risk of stroke. This is more likely to develop in people who have high blood pressure readings, are of African descent, or are overweight.  Have your blood pressure checked: ? Every 3-5 years if you are 66-96 years of age. ? Every year if you are 93 years old or older. Diabetes Have regular diabetes screenings. This checks your fasting blood sugar level. Have the screening done:  Once every three years after age 83 if you are at a normal weight and have a low risk for diabetes.  More often and at a younger age if you are overweight or have a high risk for diabetes. What should I know about preventing infection? Hepatitis B If you have a higher risk for hepatitis B, you should be screened for this virus. Talk with your health care provider to find out if you are at risk for hepatitis B infection. Hepatitis  C Testing is recommended for:  Everyone born from [birth year] through 1963.  Anyone with known risk factors for hepatitis C. Sexually transmitted infections (STIs)  Get screened for STIs, including gonorrhea and chlamydia, if: ? You are sexually active and are younger than 38 years of age. ? You are older than 38 years of age and your health care provider tells you that you are at risk for this type of infection. ? Your sexual activity has changed since you were last screened, and you are at increased risk for chlamydia or gonorrhea. Ask your health care provider if you are at risk.  Ask your health care provider about whether you are at high risk for HIV. Your health care provider may recommend a prescription medicine to help prevent HIV infection. If you choose to take medicine to prevent HIV, you should first get tested for HIV. You should then be tested every 3 months for as long as you are taking the medicine. Pregnancy  If you are about to stop having your  period (premenopausal) and you may become pregnant, seek counseling before you get pregnant.  Take 400 to 800 micrograms (mcg) of folic acid every day if you become pregnant.  Ask for birth control (contraception) if you want to prevent pregnancy. Osteoporosis and menopause Osteoporosis is a disease in which the bones lose minerals and strength with aging. This can result in bone fractures. If you are 66 years old or older, or if you are at risk for osteoporosis and fractures, ask your health care provider if you should:  Be screened for bone loss.  Take a calcium or vitamin D supplement to lower your risk of fractures.  Be given hormone replacement therapy (HRT) to treat symptoms of menopause. Follow these instructions at home: Lifestyle  Do not use any products that contain nicotine or tobacco, such as cigarettes, e-cigarettes, and chewing tobacco. If you need help quitting, ask your health care provider.  Do not use street drugs.  Do not share needles.  Ask your health care provider for help if you need support or information about quitting drugs. Alcohol use  Do not drink alcohol if: ? Your health care provider tells you not to drink. ? You are pregnant, may be pregnant, or are planning to become pregnant.  If you drink alcohol: ? Limit how much you use to 0-1 drink a day. ? Limit intake if you are breastfeeding.  Be aware of how much alcohol is in your drink. In the [place] one drink equals one 12 oz bottle of beer (355 mL), one 5 oz glass of wine (148 mL), or one 1 oz glass of hard liquor (44 mL). General instructions  Schedule regular health, dental, and eye exams.  Stay current with your vaccines.  Tell your health care provider if: ? You often feel depressed. ? You have ever been abused or do not feel safe at home. Summary  Adopting a healthy lifestyle and getting preventive care are important in promoting health and wellness.  Follow your health care provider's  instructions about healthy diet, exercising, and getting tested or screened for diseases.  Follow your health care provider's instructions on monitoring your cholesterol and blood pressure. This information is not intended to replace advice given to you by your health care provider. Make sure you discuss any questions you have with your health care provider. Document Revised: 05/15/2018 Document Reviewed: 05/15/2018 Elsevier Patient Education  2021 [employer] 17-30 Years Old, Female Preventive  care refers to lifestyle choices and visits with your health care provider that can promote health and wellness. This includes:  A yearly physical exam. This is also called an annual wellness visit.  Regular dental and eye exams.  Immunizations.  Screening for certain conditions.  Healthy lifestyle choices, such as: ? Eating a healthy diet. ? Getting regular exercise. ? Not using drugs or products that contain nicotine and tobacco. ? Limiting alcohol use. What can I expect for my preventive care visit? Physical exam Your health care provider may check your:  Height and weight. These may be used to calculate your BMI (body mass index). BMI is a measurement that tells if you are at a healthy weight.  Heart rate and blood pressure.  Body temperature.  Skin for abnormal spots. Counseling Your health care provider may ask you questions about your:  Past medical problems.  Family's medical history.  Alcohol, tobacco, and drug use.  Emotional well-being.  Home life and relationship well-being.  Sexual activity.  Diet, exercise, and sleep habits.  Work and work Statistician.  Access to firearms.  Method of birth control.  Menstrual cycle.  Pregnancy history. What immunizations do I need? Vaccines are usually given at various ages, according to a schedule. Your health care provider will recommend vaccines for you based on your age, medical history, and  lifestyle or other factors, such as travel or where you work.   What tests do I need? Blood tests  Lipid and cholesterol levels. These may be checked every 5 years starting at age 22.  Hepatitis C test.  Hepatitis B test. Screening  Diabetes screening. This is done by checking your blood sugar (glucose) after you have not eaten for a while (fasting).  STD (sexually transmitted disease) testing, if you are at risk.  BRCA-related cancer screening. This may be done if you have a family history of breast, ovarian, tubal, or peritoneal cancers.  Pelvic exam and Pap test. This may be done every 3 years starting at age 88. Starting at age 12, this may be done every 5 years if you have a Pap test in combination with an HPV test. Talk with your health care provider about your test results, treatment options, and if necessary, the need for more tests.   Follow these instructions at home: Eating and drinking  Eat a healthy diet that includes fresh fruits and vegetables, whole grains, lean protein, and low-fat dairy products.  Take vitamin and mineral supplements as recommended by your health care provider.  Do not drink alcohol if: ? Your health care provider tells you not to drink. ? You are pregnant, may be pregnant, or are planning to become pregnant.  If you drink alcohol: ? Limit how much you have to 0-1 drink a day. ? Be aware of how much alcohol is in your drink. In the [place] one drink equals one 12 oz bottle of beer (355 mL), one 5 oz glass of wine (148 mL), or one 1 oz glass of hard liquor (44 mL).   Lifestyle  Take daily care of your teeth and gums. Brush your teeth every morning and night with fluoride toothpaste. Floss one time each day.  Stay active. Exercise for at least 30 minutes 5 or more days each week.  Do not use any products that contain nicotine or tobacco, such as cigarettes, e-cigarettes, and chewing tobacco. If you need help quitting, ask your health care  provider.  Do not use drugs.  If you are  sexually active, practice safe sex. Use a condom or other form of protection to prevent STIs (sexually transmitted infections).  If you do not wish to become pregnant, use a form of birth control. If you plan to become pregnant, see your health care provider for a prepregnancy visit.  Find healthy ways to cope with stress, such as: ? Meditation, yoga, or listening to music. ? Journaling. ? Talking to a trusted person. ? Spending time with friends and family. Safety  Always wear your seat belt while driving or riding in a vehicle.  Do not drive: ? If you have been drinking alcohol. Do not ride with someone who has been drinking. ? When you are tired or distracted. ? While texting.  Wear a helmet and other protective equipment during sports activities.  If you have firearms in your house, make sure you follow all gun safety procedures.  Seek help if you have been physically or sexually abused. What's next?  Go to your health care provider once a year for an annual wellness visit.  Ask your health care provider how often you should have your eyes and teeth checked.  Stay up to date on all vaccines. This information is not intended to replace advice given to you by your health care provider. Make sure you discuss any questions you have with your health care provider. Document Revised: 01/18/2020 Document Reviewed: 01/31/2018 Elsevier Patient Education  2021 [employer].  Vitamin D Deficiency Vitamin D deficiency is when your body does not have enough vitamin D. Vitamin D is important to your body because:  It helps your body use other minerals.  It helps to keep your bones strong and healthy.  It may help to prevent some diseases.  It helps your heart and other muscles work well. Not getting enough vitamin D can make your bones soft. It can also cause other health problems. What are the causes? This condition may be caused  by:  Not eating enough foods that contain vitamin D.  Not getting enough sun.  Having diseases that make it hard for your body to absorb vitamin D.  Having a surgery in which a part of the stomach or a part of the small intestine is removed.  Having kidney disease or liver disease. What increases the risk? You are more likely to get this condition if:  You are older.  You do not spend much time outdoors.  You live in a nursing home.  You have had broken bones.  You have weak or thin bones (osteoporosis).  You have a disease or condition that changes how your body absorbs vitamin D.  You have dark skin.  You take certain medicines.  You are overweight or obese. What are the signs or symptoms?  In mild cases, there may not be any symptoms. If the condition is very bad, symptoms may include: ? Bone pain. ? Muscle pain. ? Falling often. ? Broken bones caused by a minor injury. How is this treated? Treatment may include taking supplements as told by your doctor. Your doctor will tell you what dose is best for you. Supplements may include:  Vitamin D.  Calcium. Follow these instructions at home: Eating and drinking  Eat foods that contain vitamin D, such as: ? Dairy products, cereals, or juices with added vitamin D. Check the label. ? Fish, such as salmon or trout. ? Eggs. ? Oysters. ? Mushrooms. The items listed above may not be a complete list of what you can eat  and drink. Contact a dietitian for more options.   General instructions  Take medicines and supplements only as told by your doctor.  Get regular, safe exposure to natural sunlight.  Do not use a tanning bed.  Maintain a healthy weight. Lose weight if needed.  Keep all follow-up visits as told by your doctor. This is important. How is this prevented?  You can get vitamin D by: ? Eating foods that naturally contain vitamin D. ? Eating or drinking products that have vitamin D added to them, such  as cereals, juices, and milk. ? Taking vitamin D or a multivitamin that contains vitamin D. ? Being in the sun. Your body makes vitamin D when your skin is exposed to sunlight. Your body changes the sunlight into a form of the vitamin that it can use. Contact a doctor if:  Your symptoms do not go away.  You feel sick to your stomach (nauseous).  You throw up (vomit).  You poop less often than normal, or you have trouble pooping (constipation). Summary  Vitamin D deficiency is when your body does not have enough vitamin D.  Vitamin D helps to keep your bones strong and healthy.  This condition is often treated by taking a supplement.  Your doctor will tell you what dose is best for you. This information is not intended to replace advice given to you by your health care provider. Make sure you discuss any questions you have with your health care provider. Document Revised: 01/28/2018 Document Reviewed: 01/28/2018 Elsevier Patient Education  2021 [employer].      Attention Deficit Hyperactivity Disorder, Adult Attention deficit hyperactivity disorder (ADHD) is a mental health disorder that starts during childhood (neurodevelopmental disorder). For many people with ADHD, the disorder continues into the adult years. Treatment can help you manage your symptoms. What are the causes? The exact cause of ADHD is not known. Most experts believe genetics and environmental factors contribute to ADHD. What increases the risk? The following factors may make you more likely to develop this condition:  Having a family history of ADHD.  Being female.  Being born to a mother who smoked or drank alcohol during pregnancy.  Being exposed to lead or other toxins in the womb or early in life.  Being born before 69 weeks of pregnancy (prematurely) or at a low birth weight.  Having experienced a brain injury. What are the signs or symptoms? Symptoms of this condition depend on the type of  ADHD. The two main types are inattentive and hyperactive-impulsive. Some people may have symptoms of both types. Symptoms of the inattentive type include:  Difficulty paying attention.  Making careless mistakes.  Not following instructions.  Being disorganized.  Avoiding tasks that require time and attention.  Losing and forgetting things.  Being easily distracted. Symptoms of the hyperactive-impulsive type include:  Restlessness.  Talking too much.  Interrupting.  Difficulty with: ? Sitting still. ? Feeling motivated. ? Relaxing. ? Waiting in line or waiting for a turn. In adults, this condition may lead to certain problems, such as:  Keeping jobs.  Performing tasks at work.  Having stable relationships.  Being on time or keeping to a schedule. How is this diagnosed? This condition is diagnosed based on your current symptoms and your history of symptoms. The diagnosis can be made by a health care provider such as a primary care provider or a mental health care specialist. Your health care provider may use a symptom checklist or a behavior rating  scale to evaluate your symptoms. He or she may also want to talk with people who have observed your behaviors throughout your life. How is this treated? This condition can be treated with medicines and behavior therapy. Medicines may be the best option to reduce impulsive behaviors and improve attention. Your health care provider may recommend:  Stimulant medicines. These are the most common medicines used for adult ADHD. They affect certain chemicals in the brain (neurotransmitters) and improve your ability to control your symptoms.  A non-stimulant medicine for adult ADHD (atomoxetine). This medicine increases a neurotransmitter called norepinephrine. It may take weeks to months to see effects from this medicine. Counseling and behavioral management are also important for treating ADHD. Counseling is often used along with  medicine. Your health care provider may suggest:  Cognitive behavioral therapy (CBT). This type of therapy teaches you to replace negative thoughts and actions with positive thoughts and actions. When used as part of ADHD treatment, this therapy may also include: ? Coping strategies for organization, time management, impulse control, and stress reduction. ? Mindfulness and meditation training.  Behavioral management. You may work with a Leisure [facility] manager who is specially trained to help people with ADHD manage and organize activities and function more effectively. Follow these instructions at home: Medicines  Take over-the-counter and prescription medicines only as told by your health care provider.  Talk with your health care provider about the possible side effects of your medicines and how to manage them.   Lifestyle  Do not use drugs.  Do not drink alcohol if: ? Your health care provider tells you not to drink. ? You are pregnant, may be pregnant, or are planning to become pregnant.  If you drink alcohol: ? Limit how much you use to:  0-1 drink a day for women.  0-2 drinks a day for men. ? Be aware of how much alcohol is in your drink. In the [place] one drink equals one 12 oz bottle of beer (355 mL), one 5 oz glass of wine (148 mL), or one 1 oz glass of hard liquor (44 mL).  Get enough sleep.  Eat a healthy diet.  Exercise regularly. Exercise can help to reduce stress and anxiety.   General instructions  Learn as much as you can about adult ADHD, and work closely with your health care providers to find the treatments that work best for you.  Follow the same schedule each day.  Use reminder devices like notes, calendars, and phone apps to stay on time and organized.  Keep all follow-up visits as told by your health care provider and therapist. This is important. Where to find more information A health care provider may be able to recommend resources that are available online or  over the phone. You could start with:  Attention Deficit Disorder Association (ADDA): PubAddiction.co.nz  National Institute of Mental Health [facility]): https://carter.com/ Contact a health care provider if:  Your symptoms continue to cause problems.  You have side effects from your medicine, such as: ? Repeated muscle twitches, coughing, or speech outbursts. ? Sleep problems. ? Loss of appetite. ? Dizziness. ? Unusually fast heartbeat. ? Stomach pains. ? Headaches.  You are struggling with anxiety, depression, or substance abuse. Get help right away if you:  Have a severe reaction to a medicine. If you ever feel like you may hurt yourself or others, or have thoughts about taking your own life, get help right away. You can go to the nearest emergency department or call:  Your local emergency services (911 in the [place]  A suicide crisis helpline, such as the [place] at (709)358-8836. This is open 24 hours a day. Summary  ADHD is a mental health disorder that starts during childhood (neurodevelopmental disorder) and often continues into the adult years.  The exact cause of ADHD is not known. Most experts believe genetics and environmental factors contribute to ADHD.  There is no cure for ADHD, but treatment with medicine, cognitive behavioral therapy, or behavioral management can help you manage your condition. This information is not intended to replace advice given to you by your health care provider. Make sure you discuss any questions you have with your health care provider. Document Revised: 10/14/2018 Document Reviewed: 10/14/2018 Elsevier Patient Education  2021 [place]. Amphetamine; Dextroamphetamine tablets What is this medicine? AMPHETAMINE; DEXTROAMPHETAMINE(am FET a meen; dex troe am FET a meen) is used to treat attention-deficit hyperactivity disorder (ADHD). It may also be used for narcolepsy. Federal law prohibits giving this medicine to any  person other than the person for whom it was prescribed. Do not share this medicine with anyone else. This medicine may be used for other purposes; ask your health care provider or pharmacist if you have questions. COMMON BRAND NAME(S): Adderall What should I tell my health care provider before I take this medicine? They need to know if you have any of these conditions:  anxiety or panic attacks  circulation problems in fingers and toes  glaucoma  hardening or blockages of the arteries or heart blood vessels  heart disease or a heart defect  high blood pressure  history of a drug or alcohol abuse problem  history of stroke  kidney disease  liver disease  mental illness  seizures  suicidal thoughts, plans, or attempt; a previous suicide attempt by you or a family member  thyroid disease  Tourette's syndrome  an unusual or allergic reaction to dextroamphetamine, other amphetamines, other medicines, foods, dyes, or preservatives  pregnant or trying to get pregnant  breast-feeding How should I use this medicine? Take this medicine by mouth. Take it as directed on the prescription label at the same time every day. Usually the last dose of the day will be taken at least 4 to 6 hours before bedtime, so it will not interfere with sleep. Keep taking it unless your health care provider tells you to stop. A special MedGuide will be given to you by the pharmacist with each prescription and refill. Be sure to read this information carefully each time. Talk to your health care provider about the use of this medicine in children. While it may be prescribed for children as young as 3 years for selected conditions, precautions do apply. Overdosage: If you think you have taken too much of this medicine contact a poison control center or emergency room at once. NOTE: This medicine is only for you. Do not share this medicine with others. What if I miss a dose? If you miss a dose, take it  as soon as you can. If it is almost time for your next dose, take only that dose. Do not take double or extra doses. What may interact with this medicine? Do not take this medicine with any of the following medications:  MAOIs like Carbex, Eldepryl, Marplan, Nardil, and Parnate  other stimulant medicines for attention disorders This medicine may also interact with the following medications:  acetazolamide  ammonium chloride  antacids  ascorbic acid  atomoxetine  caffeine  certain  medicines for blood pressure  certain medicines for depression, anxiety, or psychotic disturbances  certain medicines for seizures like carbamazepine, phenobarbital, phenytoin  certain medicines for stomach problems like cimetidine, ranitidine, famotidine, esomeprazole, omeprazole, lansoprazole, pantoprazole  lithium  medicines for colds and breathing difficulties  medicines for diabetes  medicines or dietary supplements for weight loss or to stay awake  methenamine  narcotic medicines for pain  quinidine  ritonavir  sodium bicarbonate  St. John's wort This list may not describe all possible interactions. Give your health care provider a list of all the medicines, herbs, non-prescription drugs, or dietary supplements you use. Also tell them if you smoke, drink alcohol, or use illegal drugs. Some items may interact with your medicine. What should I watch for while using this medicine? Visit your doctor or health care professional for regular checks on your progress. This prescription requires that you follow special procedures with your doctor and pharmacy. You will need to have a new written prescription from your doctor every time you need a refill. This medicine may affect your concentration, or hide signs of tiredness. Until you know how this medicine affects you, do not drive, ride a bicycle, use machinery, or do anything that needs mental alertness. Tell your doctor or health care  professional if this medicine loses its effects, or if you feel you need to take more than the prescribed amount. Do not change the dosage without talking to your doctor or health care professional. Decreased appetite is a common side effect when starting this medicine. Eating small, frequent meals or snacks can help. Talk to your doctor if you continue to have poor eating habits. Height and weight growth of a child taking this medicine will be monitored closely. Do not take this medicine close to bedtime. It may prevent you from sleeping. If you are going to need surgery, a MRI, CT scan, or other procedure, tell your doctor that you are taking this medicine. You may need to stop taking this medicine before the procedure. Tell your doctor or healthcare professional right away if you notice unexplained wounds on your fingers and toes while taking this medicine. You should also tell your healthcare provider if you experience numbness or pain, changes in the skin color, or sensitivity to temperature in your fingers or toes. What side effects may I notice from receiving this medicine? Side effects that you should report to your doctor or health care professional as soon as possible:  allergic reactions like skin rash, itching or hives, swelling of the face, lips, or tongue  anxious  breathing problems  changes in emotions or moods  changes in vision  chest pain or chest tightness  fast, irregular heartbeat  fingers or toes feel numb, cool, painful  hallucination, loss of contact with reality  high blood pressure  males: prolonged or painful erection  seizures  signs and symptoms of serotonin syndrome like confusion, increased sweating, fever, tremor, stiff muscles, diarrhea  signs and symptoms of a stroke like changes in vision; confusion; trouble speaking or understanding; severe headaches; sudden numbness or weakness of the face, arm or leg; trouble walking; dizziness; loss of balance  or coordination  suicidal thoughts or other mood changes  uncontrollable head, mouth, neck, arm, or leg movements Side effects that usually do not require medical attention (report to your doctor or health care professional if they continue or are bothersome):  dry mouth  headache  irritability  loss of appetite  nausea  trouble sleeping  weight  loss This list may not describe all possible side effects. Call your doctor for medical advice about side effects. You may report side effects to FDA at 1-800-FDA-1088. Where should I keep my medicine? Keep out of the reach of children. This medicine can be abused. Keep your medicine in a safe place to protect it from theft. Do not share this medicine with anyone. Selling or giving away this medicine is dangerous and against the law. Store at room temperature between 15 and 30 degrees C (59 and 86 degrees F). Keep container tightly closed. Throw away any unused medicine after the expiration date. Dispose of properly. This medicine may cause accidental overdose and death if it is taken by other adults, children, or pets. Mix any unused medicine with a substance like cat litter or coffee grounds. Then throw the medicine away in a sealed container like a sealed bag or a coffee can with a lid. Do not use the medicine after the expiration date. NOTE: This sheet is a summary. It may not cover all possible information. If you have questions about this medicine, talk to your doctor, pharmacist, or health care provider.  2021 Elsevier/Gold Standard (2020-03-17 14:21:45) Atomoxetine capsules What is this medicine? ATOMOXETINE (AT oh mox e teen) is used to treat attention deficit/hyperactivity disorder, also known as ADHD. It is not a stimulant like other drugs for ADHD. This drug can improve attention span, concentration, and emotional control. It can also reduce restless or overactive behavior. This medicine may be used for other purposes; ask your  health care provider or pharmacist if you have questions. COMMON BRAND NAME(S): Strattera What should I tell my health care provider before I take this medicine? They need to know if you have any of these conditions:  glaucoma  high or low blood pressure  history of stroke  irregular heartbeat or other cardiac disease  liver disease  mania or bipolar disorder  pheochromocytoma  suicidal thoughts  an unusual or allergic reaction to atomoxetine, other medicines, foods, dyes, or preservatives  pregnant or trying to get pregnant  breast-feeding How should I use this medicine? Take this medicine by mouth with a glass of water. Follow the directions on the prescription label. You can take it with or without food. If it upsets your stomach, take it with food. If you have difficulty sleeping and you take more than 1 dose per day, take your last dose before 6 PM. Take your medicine at regular intervals. Do not take it more often than directed. Do not stop taking except on your doctor's advice. A special MedGuide will be given to you by the pharmacist with each prescription and refill. Be sure to read this information carefully each time. Talk to your pediatrician regarding the use of this medicine in children. While this drug may be prescribed for children as young as 6 years for selected conditions, precautions do apply. Overdosage: If you think you have taken too much of this medicine contact a poison control center or emergency room at once. NOTE: This medicine is only for you. Do not share this medicine with others. What if I miss a dose? If you miss a dose, take it as soon as you can. If it is almost time for your next dose, take only that dose. Do not take double or extra doses. What may interact with this medicine? Do not take this medicine with any of the following medications:  cisapride  dronedarone  MAOIs like Carbex, Eldepryl, Marplan, Nardil, and  Parnate  pimozide  reboxetine  thioridazine This medicine may also interact with the following medications:  certain medicines for blood pressure, heart disease, irregular heart beat  certain medicines for depression, anxiety, or psychotic disturbances  certain medicines for lung disease like albuterol  cold or allergy medicines  dofetilide  fluoxetine  medicines that increase blood pressure like dopamine, dobutamine, or ephedrine  other medicines that prolong the QT interval (cause an abnormal heart rhythm)  paroxetine  quinidine  stimulant medicines for attention disorders, weight loss, or to stay awake  ziprasidone This list may not describe all possible interactions. Give your health care provider a list of all the medicines, herbs, non-prescription drugs, or dietary supplements you use. Also tell them if you smoke, drink alcohol, or use illegal drugs. Some items may interact with your medicine. What should I watch for while using this medicine? It may take a week or more for this medicine to take effect. This is why it is very important to continue taking the medicine and not miss any doses. If you have been taking this medicine regularly for some time, do not suddenly stop taking it. Ask your doctor or health care professional for advice. Rarely, this medicine may increase thoughts of suicide or suicide attempts in children and teenagers. Call your child's health care professional right away if your child or teenager has new or increased thoughts of suicide or has changes in mood or behavior like becoming irritable or anxious. Regularly monitor your child for these behavioral changes. For males, contact you doctor or health care professional right away if you have an erection that lasts longer than 4 hours or if it becomes painful. This may be a sign of serious problem and must be treated right away to prevent permanent damage. You may get drowsy or dizzy. Do not drive, use  machinery, or do anything that needs mental alertness until you know how this medicine affects you. Do not stand or sit up quickly, especially if you are an older patient. This reduces the risk of dizzy or fainting spells. Alcohol can make you more drowsy and dizzy. Avoid alcoholic drinks. Do not treat yourself for coughs, colds or allergies without asking your doctor or health care professional for advice. Some ingredients can increase possible side effects. Your mouth may get dry. Chewing sugarless gum or sucking hard candy, and drinking plenty of water will help. What side effects may I notice from receiving this medicine? Side effects that you should report to your doctor or health care professional as soon as possible:  allergic reactions like skin rash, itching or hives, swelling of the face, lips, or tongue  breathing problems  chest pain  dark urine  fast, irregular heartbeat  general ill feeling or flu-like symptoms  high blood pressure  males: prolonged or painful erection  stomach pain or tenderness  trouble passing urine or change in the amount of urine  vomiting  weight loss  yellowing of the eyes or skin Side effects that usually do not require medical attention (report to your doctor or health care professional if they continue or are bothersome):  change in sex drive or performance  constipation or diarrhea  headache  loss of appetite  menstrual period irregularities  nausea  stomach upset This list may not describe all possible side effects. Call your doctor for medical advice about side effects. You may report side effects to FDA at 1-800-FDA-1088. Where should I keep my medicine? Keep out of the reach  of children. Store at room temperature between 15 and 30 degrees C (59 and 86 degrees F). Throw away any unused medication after the expiration date. NOTE: This sheet is a summary. It may not cover all possible information. If you have questions about  this medicine, talk to your doctor, pharmacist, or health care provider.  2021 Elsevier/Gold Standard (2018-04-24 15:02:07)

## 2020-07-12 LAB — IGP, APTIMA HPV, RFX 16/18,45
HPV Aptima: NEGATIVE
PAP Smear Comment: 0

## 2020-07-13 ENCOUNTER — Other Ambulatory Visit: Payer: No Typology Code available for payment source

## 2020-07-13 ENCOUNTER — Other Ambulatory Visit: Payer: Self-pay

## 2020-07-13 ENCOUNTER — Other Ambulatory Visit: Payer: Self-pay | Admitting: Nurse Practitioner

## 2020-07-13 ENCOUNTER — Telehealth: Payer: Self-pay | Admitting: Nurse Practitioner

## 2020-07-13 DIAGNOSIS — L732 Hidradenitis suppurativa: Secondary | ICD-10-CM

## 2020-07-13 DIAGNOSIS — F909 Attention-deficit hyperactivity disorder, unspecified type: Secondary | ICD-10-CM

## 2020-07-13 MED ORDER — ATOMOXETINE HCL 40 MG PO CAPS: ORAL_CAPSULE | ORAL | 0 refills | Status: DC

## 2020-07-13 MED ORDER — DOXYCYCLINE HYCLATE 100 MG PO TABS: 100.0000 mg | ORAL_TABLET | Freq: Two times a day (BID) | ORAL | 2 refills | Status: DC

## 2020-07-13 NOTE — Telephone Encounter (Signed)
Pt has requested Strattera prescription for adult ADHD. She has requested dermatology referral for chronic bilateral axillary hidratinitis suppurativa. Doxycycline 100 mg BID sent to pharmacy.

## 2020-07-14 LAB — COMPREHENSIVE METABOLIC PANEL
ALT: 15 IU/L (ref 0–32)
AST: 16 IU/L (ref 0–40)
Albumin/Globulin Ratio: 1.9 (ref 1.2–2.2)
Albumin: 4.2 g/dL (ref 3.8–4.8)
Alkaline Phosphatase: 75 IU/L (ref 44–121)
BUN/Creatinine Ratio: 18 (ref 9–23)
BUN: 12 mg/dL (ref 6–20)
Bilirubin Total: 0.4 mg/dL (ref 0.0–1.2)
CO2: 19 mmol/L — ABNORMAL LOW (ref 20–29)
Calcium: 9.2 mg/dL (ref 8.7–10.2)
Chloride: 106 mmol/L (ref 96–106)
Creatinine, Ser: 0.68 mg/dL (ref 0.57–1.00)
GFR calc Af Amer: 129 mL/min/{1.73_m2} (ref >59)
GFR calc non Af Amer: 112 mL/min/{1.73_m2} (ref >59)
Globulin, Total: 2.2 g/dL (ref 1.5–4.5)
Glucose: 91 mg/dL (ref 65–99)
Potassium: 4.2 mmol/L (ref 3.5–5.2)
Sodium: 140 mmol/L (ref 134–144)
Total Protein: 6.4 g/dL (ref 6.0–8.5)

## 2020-07-14 LAB — CBC WITH DIFFERENTIAL/PLATELET
Basophils Absolute: 0 10*3/uL (ref 0.0–0.2)
Basos: 1 %
EOS (ABSOLUTE): 0.1 10*3/uL (ref 0.0–0.4)
Eos: 2 %
Hematocrit: 39.7 % (ref 34.0–46.6)
Hemoglobin: 13.3 g/dL (ref 11.1–15.9)
Immature Grans (Abs): 0 10*3/uL (ref 0.0–0.1)
Immature Granulocytes: 0 %
Lymphocytes Absolute: 1.5 10*3/uL (ref 0.7–3.1)
Lymphs: 25 %
MCH: 28.8 pg (ref 26.6–33.0)
MCHC: 33.5 g/dL (ref 31.5–35.7)
MCV: 86 fL (ref 79–97)
Monocytes Absolute: 0.4 10*3/uL (ref 0.1–0.9)
Monocytes: 7 %
Neutrophils Absolute: 3.8 10*3/uL (ref 1.4–7.0)
Neutrophils: 65 %
Platelets: 275 10*3/uL (ref 150–450)
RBC: 4.62 x10E6/uL (ref 3.77–5.28)
RDW: 13.1 % (ref 11.7–15.4)
WBC: 5.9 10*3/uL (ref 3.4–10.8)

## 2020-07-14 LAB — TSH: TSH: 1.11 u[IU]/mL (ref 0.450–4.500)

## 2020-07-14 LAB — LIPID PANEL
Chol/HDL Ratio: 4.2 ratio (ref 0.0–4.4)
Cholesterol, Total: 168 mg/dL (ref 100–199)
HDL: 40 mg/dL (ref >39)
LDL Chol Calc (NIH): 102 mg/dL — ABNORMAL HIGH (ref 0–99)
Triglycerides: 146 mg/dL (ref 0–149)
VLDL Cholesterol Cal: 26 mg/dL (ref 5–40)

## 2020-07-14 LAB — VITAMIN D 25 HYDROXY (VIT D DEFICIENCY, FRACTURES): Vit D, 25-Hydroxy: 41.5 ng/mL (ref 30.0–100.0)

## 2020-07-14 LAB — CARDIOVASCULAR RISK ASSESSMENT

## 2020-08-26 ENCOUNTER — Other Ambulatory Visit (HOSPITAL_COMMUNITY): Payer: Self-pay | Admitting: Pharmacist

## 2020-08-26 MED FILL — CARESTART COVID-19 HOME TES: 2 days supply | Qty: 2 | Fill #0

## 2020-09-13 ENCOUNTER — Ambulatory Visit (INDEPENDENT_AMBULATORY_CARE_PROVIDER_SITE_OTHER): Payer: No Typology Code available for payment source | Admitting: Nurse Practitioner

## 2020-09-13 ENCOUNTER — Other Ambulatory Visit: Payer: Self-pay

## 2020-09-13 ENCOUNTER — Other Ambulatory Visit: Payer: Self-pay | Admitting: Nurse Practitioner

## 2020-09-13 ENCOUNTER — Other Ambulatory Visit: Payer: Self-pay | Admitting: Family Medicine

## 2020-09-13 ENCOUNTER — Encounter: Payer: Self-pay | Admitting: Nurse Practitioner

## 2020-09-13 VITALS — BP 116/80 | HR 100 | Temp 97.4°F | Resp 16 | Ht 67.0 in | Wt 236.0 lb

## 2020-09-13 DIAGNOSIS — N912 Amenorrhea, unspecified: Secondary | ICD-10-CM | POA: Diagnosis not present

## 2020-09-13 DIAGNOSIS — F900 Attention-deficit hyperactivity disorder, predominantly inattentive type: Secondary | ICD-10-CM | POA: Diagnosis not present

## 2020-09-13 LAB — POCT URINE PREGNANCY: Preg Test, Ur: NEGATIVE

## 2020-09-13 MED ORDER — MEDROXYPROGESTERONE ACETATE 150 MG/ML IM SUSY: PREFILLED_SYRINGE | INTRAMUSCULAR | 1 refills | Status: DC

## 2020-09-13 MED ORDER — AMPHETAMINE-DEXTROAMPHETAMINE 20 MG PO TABS: 20.0000 mg | ORAL_TABLET | Freq: Every day | ORAL | 0 refills | Status: DC

## 2020-09-13 MED ORDER — RIZATRIPTAN BENZOATE 10 MG PO TABS: 10.0000 mg | ORAL_TABLET | ORAL | 2 refills | Status: DC | PRN

## 2020-09-13 NOTE — Progress Notes (Signed)
Established Patient Office Visit  Subjective:  Patient ID: Lauren Faulkner, female    DOB: 22-Jul-1980  Age: 40 y.o. MRN: [id number]  CC:  Chief Complaint  Patient presents with  . Adult ADHD follow-up    HPI Lauren Faulkner presents for adult ADHD, inattentive type. Current treatment includes Strattera 40 mg daily. She states the medication is not adequately controlling inattentiveness. 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 Adult ADHD screening positive. Lauren Faulkner states she began experiencing symptoms of inattentiveness around age 40. She has taken Adderall before, she has requested to resume this medication.   Lauren Faulkner uses Depo Provera for contraception. States next injection is due in 3-weeks. She is amenorrheic due to injection. Urine HCG negative in office today.   Past Medical History:  Diagnosis Date  . Adenomyosis    fibroadenomoa R breast  . Hypothyroid   . Insomnia   . Interstitial cystitis   . Migraines   . Vitamin D deficiency     Past Surgical History:  Procedure Laterality Date  . CERVICAL CONE BIOPSY      Family History  Problem Relation Age of Onset  . Diabetes Mother   . Hyperlipidemia Mother   . Fibroids Mother   . Diabetes Father   . Hypertension Father   . Hyperlipidemia Father   . Ovarian cancer Maternal Grandmother   . Stroke Maternal Grandmother     Social History   Socioeconomic History  . Marital status: Married    Spouse name: Not on file  . Number of children: Not on file  . Years of education: Not on file  . Highest education level: Not on file  Occupational History  . Not on file  Tobacco Use  . Smoking status: Former Smoker    Quit date: 06/05/2002    Years since quitting: 18.2  . Smokeless tobacco: Never Used  Substance and Sexual Activity  . Alcohol use: Yes    Comment: occ  . Drug use: No  . Sexual activity: Not on file  Other Topics Concern  . Not on file  Social History  Narrative   Pt is Marine scientist, employed at [employer].  Married.  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   Caffeine  1 cup per day.     Social Determinants of Health   Financial Resource Strain: Not on file  Food Insecurity: Not on file  Transportation Needs: Not on file  Physical Activity: Not on file  Stress: Not on file  Social Connections: Not on file  Intimate Partner Violence: Not on file    Outpatient Medications Prior to Visit  Medication Sig Dispense Refill  . atomoxetine (STRATTERA) 40 MG capsule Take one tablet by mouth daily for three days, then increase to 80 mg by mouth daily 57 capsule 0  . cholecalciferol (VITAMIN D) 1000 units tablet Take 1,000 Units by mouth daily.    . medroxyPROGESTERone Acetate 150 MG/ML SUSY inject 1 milliliter (150 mg) by intramuscular route every 3 months 2 mL 0  . Multiple Vitamin (MULTIVITAMIN) tablet Take 1 tablet by mouth daily.    . ondansetron (ZOFRAN-ODT) 8 MG disintegrating tablet DISSOLVE ONE TABLET BY MOUTH EVERY 6 HOURS AS NEEDED 30 tablet 1  . rizatriptan (MAXALT) 10 MG tablet Take 10 mg by mouth as needed for migraine. May repeat in 2 hours if needed    . COVID-19 At Home Antigen Test KIT USE AS DIRECTED WITHIN PACKAGE INSTRUCTIONS (Patient taking differently: No sig reported)  2 each 0  . doxycycline (VIBRA-TABS) 100 MG tablet Take 1 tablet (100 mg total) by mouth 2 (two) times daily. 20 tablet 2  . escitalopram (LEXAPRO) 10 MG tablet Take 1 tablet (10 mg total) by mouth daily. 90 tablet 1   No facility-administered medications prior to visit.    No Known Allergies  ROS Review of Systems  Constitutional: Positive for fatigue. Negative for appetite change and unexpected weight change.  HENT: Negative for congestion, ear pain, rhinorrhea, sinus pressure, sinus pain and tinnitus.   Eyes: Negative for pain.  Respiratory: Negative for cough and shortness of breath.   Cardiovascular: Negative for chest pain, palpitations and leg swelling.   Gastrointestinal: Negative for abdominal pain, constipation, diarrhea, nausea and vomiting.  Endocrine: Negative for cold intolerance, heat intolerance, polydipsia, polyphagia and polyuria.  Genitourinary: Positive for menstrual problem (amenorhhea). Negative for dysuria, frequency and hematuria.  Musculoskeletal: Negative for arthralgias, back pain, joint swelling and myalgias.  Skin: Negative for rash.  Allergic/Immunologic: Negative for environmental allergies.  Neurological: Negative for dizziness and headaches.  Hematological: Negative for adenopathy.  Psychiatric/Behavioral: Positive for decreased concentration. Negative for sleep disturbance. The patient is not nervous/anxious.       Objective:    Physical Exam Constitutional:      Appearance: Normal appearance. She is obese.  HENT:     Head: Normocephalic.     Right Ear: Tympanic membrane normal.  Neck:     Vascular: No carotid bruit.  Cardiovascular:     Rate and Rhythm: Normal rate and regular rhythm.     Pulses: Normal pulses.     Heart sounds: Normal heart sounds.  Pulmonary:     Effort: Pulmonary effort is normal.     Breath sounds: Normal breath sounds.  Abdominal:     General: Bowel sounds are normal.     Palpations: Abdomen is soft.     Tenderness: There is no abdominal tenderness. There is no guarding.  Musculoskeletal:        General: No swelling.  Skin:    General: Skin is warm and dry.     Capillary Refill: Capillary refill takes less than 2 seconds.  Neurological:     Mental Status: She is alert and oriented to person, place, and time.  Psychiatric:        Mood and Affect: Mood normal.        Behavior: Behavior normal.     BP 116/80   Pulse 100   Temp (!) 97.4 F (36.3 C)   Resp 16   Ht $R'5\' 7"'nG$  (1.702 m)   Wt 236 lb (107 kg)   BMI 36.96 kg/m  Wt Readings from Last 3 Encounters:  09/13/20 236 lb (107 kg)  07/08/20 248 lb (112.5 kg)  02/26/20 240 lb (108.9 kg)     Health Maintenance Due   Topic Date Due  . Hepatitis C Screening  Never done  . HIV Screening  Never done     Lab Results  Component Value Date   TSH 1.110 07/13/2020   Lab Results  Component Value Date   WBC 5.9 07/13/2020   HGB 13.3 07/13/2020   HCT 39.7 07/13/2020   MCV 86 07/13/2020   PLT 275 07/13/2020   Lab Results  Component Value Date   NA 140 07/13/2020   K 4.2 07/13/2020   CO2 19 (L) 07/13/2020   GLUCOSE 91 07/13/2020   BUN 12 07/13/2020   CREATININE 0.68 07/13/2020   BILITOT 0.4 07/13/2020  ALKPHOS 75 07/13/2020   AST 16 07/13/2020   ALT 15 07/13/2020   PROT 6.4 07/13/2020   ALBUMIN 4.2 07/13/2020   CALCIUM 9.2 07/13/2020   Lab Results  Component Value Date   CHOL 168 07/13/2020   Lab Results  Component Value Date   HDL 40 07/13/2020   Lab Results  Component Value Date   LDLCALC 102 (H) 07/13/2020   Lab Results  Component Value Date   TRIG 146 07/13/2020   Lab Results  Component Value Date   CHOLHDL 4.2 07/13/2020       Assessment & Plan:   1. Attention deficit hyperactivity disorder (ADHD), predominantly inattentive type - amphetamine-dextroamphetamine (ADDERALL) 20 MG tablet; Take 1 tablet (20 mg total) by mouth daily.  Dispense: 30 tablet; Refill: 0 - POCT urine pregnancy  2. Amenorrhea due to Depo Provera - POCT urine pregnancy   STOP STRATTERA BEGIN ADDERALL 20 MG DAILY, MAY BREAK IN HALF THE FIRST WEEK FOLLOW-UP IN 4 WEEKS    Follow-up: 4-weeks   Signed, Rip Harbour, NP

## 2020-09-13 NOTE — Patient Instructions (Addendum)
STOP STRATTERA BEGIN ADDERALL 20 MG DAILY, MAY BREAK IN HALF THE FIRST WEEK FOLLOW-UP IN 4 WEEKS  Attention Deficit Hyperactivity Disorder, Adult Attention deficit hyperactivity disorder (ADHD) is a mental health disorder that starts during childhood (neurodevelopmental disorder). For many people with ADHD, the disorder continues into the adult years. Treatment can help you manage your symptoms. What are the causes? The exact cause of ADHD is not known. Most experts believe genetics and environmental factors contribute to ADHD. What increases the risk? The following factors may make you more likely to develop this condition:  Having a family history of ADHD.  Being female.  Being born to a mother who smoked or drank alcohol during pregnancy.  Being exposed to lead or other toxins in the womb or early in life.  Being born before 37 weeks of pregnancy (prematurely) or at a low birth weight.  Having experienced a brain injury. What are the signs or symptoms? Symptoms of this condition depend on the type of ADHD. The two main types are inattentive and hyperactive-impulsive. Some people may have symptoms of both types. Symptoms of the inattentive type include:  Difficulty paying attention.  Making careless mistakes.  Not following instructions.  Being disorganized.  Avoiding tasks that require time and attention.  Losing and forgetting things.  Being easily distracted. Symptoms of the hyperactive-impulsive type include:  Restlessness.  Talking too much.  Interrupting.  Difficulty with: ? Sitting still. ? Feeling motivated. ? Relaxing. ? Waiting in line or waiting for a turn. In adults, this condition may lead to certain problems, such as:  Keeping jobs.  Performing tasks at work.  Having stable relationships.  Being on time or keeping to a schedule. How is this diagnosed? This condition is diagnosed based on your current symptoms and your history of symptoms.  The diagnosis can be made by a health care provider such as a primary care provider or a mental health care specialist. Your health care provider may use a symptom checklist or a behavior rating scale to evaluate your symptoms. He or she may also want to talk with people who have observed your behaviors throughout your life. How is this treated? This condition can be treated with medicines and behavior therapy. Medicines may be the best option to reduce impulsive behaviors and improve attention. Your health care provider may recommend:  Stimulant medicines. These are the most common medicines used for adult ADHD. They affect certain chemicals in the brain (neurotransmitters) and improve your ability to control your symptoms.  A non-stimulant medicine for adult ADHD (atomoxetine). This medicine increases a neurotransmitter called norepinephrine. It may take weeks to months to see effects from this medicine. Counseling and behavioral management are also important for treating ADHD. Counseling is often used along with medicine. Your health care provider may suggest:  Cognitive behavioral therapy (CBT). This type of therapy teaches you to replace negative thoughts and actions with positive thoughts and actions. When used as part of ADHD treatment, this therapy may also include: ? Coping strategies for organization, time management, impulse control, and stress reduction. ? Mindfulness and meditation training.  Behavioral management. You may work with a Psychologist, occupationalcoach who is specially trained to help people with ADHD manage and organize activities and function more effectively. Follow these instructions at home: Medicines  Take over-the-counter and prescription medicines only as told by your health care provider.  Talk with your health care provider about the possible side effects of your medicines and how to manage them.  Lifestyle  Do not use drugs.  Do not drink alcohol if: ? Your health care provider  tells you not to drink. ? You are pregnant, may be pregnant, or are planning to become pregnant.  If you drink alcohol: ? Limit how much you use to:  0-1 drink a day for women.  0-2 drinks a day for men. ? Be aware of how much alcohol is in your drink. In the [place] one drink equals one 12 oz bottle of beer (355 mL), one 5 oz glass of wine (148 mL), or one 1 oz glass of hard liquor (44 mL).  Get enough sleep.  Eat a healthy diet.  Exercise regularly. Exercise can help to reduce stress and anxiety.   General instructions  Learn as much as you can about adult ADHD, and work closely with your health care providers to find the treatments that work best for you.  Follow the same schedule each day.  Use reminder devices like notes, calendars, and phone apps to stay on time and organized.  Keep all follow-up visits as told by your health care provider and therapist. This is important. Where to find more information A health care provider may be able to recommend resources that are available online or over the phone. You could start with:  Attention Deficit Disorder Association (ADDA): http://davis-dillon.net/  [employer] of Mental Health [facility]): http://www.maynard.net/ Contact a health care provider if:  Your symptoms continue to cause problems.  You have side effects from your medicine, such as: ? Repeated muscle twitches, coughing, or speech outbursts. ? Sleep problems. ? Loss of appetite. ? Dizziness. ? Unusually fast heartbeat. ? Stomach pains. ? Headaches.  You are struggling with anxiety, depression, or substance abuse. Get help right away if you:  Have a severe reaction to a medicine. If you ever feel like you may hurt yourself or others, or have thoughts about taking your own life, get help right away. You can go to the nearest emergency department or call:  Your local emergency services (911 in the [place]  A suicide crisis helpline, such as the National Suicide Prevention  Lifeline at 416-506-2280. This is open 24 hours a day. Summary  ADHD is a mental health disorder that starts during childhood (neurodevelopmental disorder) and often continues into the adult years.  The exact cause of ADHD is not known. Most experts believe genetics and environmental factors contribute to ADHD.  There is no cure for ADHD, but treatment with medicine, cognitive behavioral therapy, or behavioral management can help you manage your condition. This information is not intended to replace advice given to you by your health care provider. Make sure you discuss any questions you have with your health care provider. Document Revised: 10/14/2018 Document Reviewed: 10/14/2018 Elsevier Patient Education  2021 Elsevier Inc.  Amphetamine; Dextroamphetamine tablets What is this medicine? AMPHETAMINE; DEXTROAMPHETAMINE(am FET a meen; dex troe am FET a meen) is used to treat attention-deficit hyperactivity disorder (ADHD). It may also be used for narcolepsy. Federal law prohibits giving this medicine to any person other than the person for whom it was prescribed. Do not share this medicine with anyone else. This medicine may be used for other purposes; ask your health care provider or pharmacist if you have questions. COMMON BRAND NAME(S): Adderall What should I tell my health care provider before I take this medicine? They need to know if you have any of these conditions:  anxiety or panic attacks  circulation problems in fingers and toes  glaucoma  hardening or blockages of the arteries or heart blood vessels  heart disease or a heart defect  high blood pressure  history of a drug or alcohol abuse problem  history of stroke  kidney disease  liver disease  mental illness  seizures  suicidal thoughts, plans, or attempt; a previous suicide attempt by you or a family member  thyroid disease  Tourette's syndrome  an unusual or allergic reaction to dextroamphetamine,  other amphetamines, other medicines, foods, dyes, or preservatives  pregnant or trying to get pregnant  breast-feeding How should I use this medicine? Take this medicine by mouth. Take it as directed on the prescription label at the same time every day. Usually the last dose of the day will be taken at least 4 to 6 hours before bedtime, so it will not interfere with sleep. Keep taking it unless your health care provider tells you to stop. A special MedGuide will be given to you by the pharmacist with each prescription and refill. Be sure to read this information carefully each time. Talk to your health care provider about the use of this medicine in children. While it may be prescribed for children as young as 3 years for selected conditions, precautions do apply. Overdosage: If you think you have taken too much of this medicine contact a poison control center or emergency room at once. NOTE: This medicine is only for you. Do not share this medicine with others. What if I miss a dose? If you miss a dose, take it as soon as you can. If it is almost time for your next dose, take only that dose. Do not take double or extra doses. What may interact with this medicine? Do not take this medicine with any of the following medications:  MAOIs like Carbex, Eldepryl, Marplan, Nardil, and Parnate  other stimulant medicines for attention disorders This medicine may also interact with the following medications:  acetazolamide  ammonium chloride  antacids  ascorbic acid  atomoxetine  caffeine  certain medicines for blood pressure  certain medicines for depression, anxiety, or psychotic disturbances  certain medicines for seizures like carbamazepine, phenobarbital, phenytoin  certain medicines for stomach problems like cimetidine, ranitidine, famotidine, esomeprazole, omeprazole, lansoprazole, pantoprazole  lithium  medicines for colds and breathing difficulties  medicines for  diabetes  medicines or dietary supplements for weight loss or to stay awake  methenamine  narcotic medicines for pain  quinidine  ritonavir  sodium bicarbonate  St. John's wort This list may not describe all possible interactions. Give your health care provider a list of all the medicines, herbs, non-prescription drugs, or dietary supplements you use. Also tell them if you smoke, drink alcohol, or use illegal drugs. Some items may interact with your medicine. What should I watch for while using this medicine? Visit your doctor or health care professional for regular checks on your progress. This prescription requires that you follow special procedures with your doctor and pharmacy. You will need to have a new written prescription from your doctor every time you need a refill. This medicine may affect your concentration, or hide signs of tiredness. Until you know how this medicine affects you, do not drive, ride a bicycle, use machinery, or do anything that needs mental alertness. Tell your doctor or health care professional if this medicine loses its effects, or if you feel you need to take more than the prescribed amount. Do not change the dosage without talking to your doctor or health care professional. Decreased  appetite is a common side effect when starting this medicine. Eating small, frequent meals or snacks can help. Talk to your doctor if you continue to have poor eating habits. Height and weight growth of a child taking this medicine will be monitored closely. Do not take this medicine close to bedtime. It may prevent you from sleeping. If you are going to need surgery, a MRI, CT scan, or other procedure, tell your doctor that you are taking this medicine. You may need to stop taking this medicine before the procedure. Tell your doctor or healthcare professional right away if you notice unexplained wounds on your fingers and toes while taking this medicine. You should also tell your  healthcare provider if you experience numbness or pain, changes in the skin color, or sensitivity to temperature in your fingers or toes. What side effects may I notice from receiving this medicine? Side effects that you should report to your doctor or health care professional as soon as possible:  allergic reactions like skin rash, itching or hives, swelling of the face, lips, or tongue  anxious  breathing problems  changes in emotions or moods  changes in vision  chest pain or chest tightness  fast, irregular heartbeat  fingers or toes feel numb, cool, painful  hallucination, loss of contact with reality  high blood pressure  males: prolonged or painful erection  seizures  signs and symptoms of serotonin syndrome like confusion, increased sweating, fever, tremor, stiff muscles, diarrhea  signs and symptoms of a stroke like changes in vision; confusion; trouble speaking or understanding; severe headaches; sudden numbness or weakness of the face, arm or leg; trouble walking; dizziness; loss of balance or coordination  suicidal thoughts or other mood changes  uncontrollable head, mouth, neck, arm, or leg movements Side effects that usually do not require medical attention (report to your doctor or health care professional if they continue or are bothersome):  dry mouth  headache  irritability  loss of appetite  nausea  trouble sleeping  weight loss This list may not describe all possible side effects. Call your doctor for medical advice about side effects. You may report side effects to FDA at 1-800-FDA-1088. Where should I keep my medicine? Keep out of the reach of children. This medicine can be abused. Keep your medicine in a safe place to protect it from theft. Do not share this medicine with anyone. Selling or giving away this medicine is dangerous and against the law. Store at room temperature between 15 and 30 degrees C (59 and 86 degrees F). Keep container  tightly closed. Throw away any unused medicine after the expiration date. Dispose of properly. This medicine may cause accidental overdose and death if it is taken by other adults, children, or pets. Mix any unused medicine with a substance like cat litter or coffee grounds. Then throw the medicine away in a sealed container like a sealed bag or a coffee can with a lid. Do not use the medicine after the expiration date. NOTE: This sheet is a summary. It may not cover all possible information. If you have questions about this medicine, talk to your doctor, pharmacist, or health care provider.  2021 Elsevier/Gold Standard (2020-03-17 14:21:45)

## 2020-10-06 ENCOUNTER — Ambulatory Visit: Payer: Self-pay | Admitting: Nurse Practitioner

## 2020-10-06 ENCOUNTER — Ambulatory Visit: Payer: Self-pay | Admitting: Family Medicine

## 2020-10-12 ENCOUNTER — Ambulatory Visit (INDEPENDENT_AMBULATORY_CARE_PROVIDER_SITE_OTHER): Payer: No Typology Code available for payment source | Admitting: Nurse Practitioner

## 2020-10-12 ENCOUNTER — Other Ambulatory Visit: Payer: Self-pay | Admitting: Nurse Practitioner

## 2020-10-12 ENCOUNTER — Other Ambulatory Visit: Payer: Self-pay

## 2020-10-12 ENCOUNTER — Encounter: Payer: Self-pay | Admitting: Nurse Practitioner

## 2020-10-12 VITALS — BP 122/72 | HR 82 | Temp 98.0°F | Ht 67.0 in | Wt 232.0 lb

## 2020-10-12 DIAGNOSIS — M545 Low back pain, unspecified: Secondary | ICD-10-CM

## 2020-10-12 DIAGNOSIS — G43709 Chronic migraine without aura, not intractable, without status migrainosus: Secondary | ICD-10-CM

## 2020-10-12 DIAGNOSIS — F900 Attention-deficit hyperactivity disorder, predominantly inattentive type: Secondary | ICD-10-CM

## 2020-10-12 MED ORDER — AMPHETAMINE-DEXTROAMPHETAMINE 20 MG PO TABS
20.0000 mg | ORAL_TABLET | Freq: Every day | ORAL | 0 refills | Status: DC
Start: 2020-10-12 — End: 2020-10-13

## 2020-10-12 MED ORDER — RIZATRIPTAN BENZOATE 10 MG PO TABS: 10.0000 mg | ORAL_TABLET | ORAL | 2 refills | Status: DC | PRN

## 2020-10-12 MED ORDER — CYCLOBENZAPRINE HCL 5 MG PO TABS: 5.0000 mg | ORAL_TABLET | Freq: Three times a day (TID) | ORAL | 1 refills | Status: DC | PRN

## 2020-10-12 NOTE — Progress Notes (Signed)
Established Patient Office Visit  Subjective:  Patient ID: Lauren Faulkner, female    DOB: 1980-11-22  Age: 40 y.o. MRN: [id number]  CC: Adult ADHD follow-up   HPI Lauren Faulkner is a 40 year old Caucasian female that presents for follow-up of adult ADHD. She was originally diagnosed with ADHD at age 6. She began Adderall 20 mg daily. She states medication is working well without side effects. She denies insomnia, palpitations, or nervousness.   Marnell tells me that she has been experiencing back pain for approximately 19 years. Previous treatments include NSAIDs and chiropractor visits. She states her back has stiffness and pain in the morning when she wakes up, gradually improves with movement.   Past Medical History:  Diagnosis Date  . Adenomyosis    fibroadenomoa R breast  . Hypothyroid   . Insomnia   . Interstitial cystitis   . Migraines   . Vitamin D deficiency     Past Surgical History:  Procedure Laterality Date  . CERVICAL CONE BIOPSY      Family History  Problem Relation Age of Onset  . Diabetes Mother   . Hyperlipidemia Mother   . Fibroids Mother   . Diabetes Father   . Hypertension Father   . Hyperlipidemia Father   . Ovarian cancer Maternal Grandmother   . Stroke Maternal Grandmother     Social History   Socioeconomic History  . Marital status: Married    Spouse name: Not on file  . Number of children: Not on file  . Years of education: Not on file  . Highest education level: Not on file  Occupational History  . Not on file  Tobacco Use  . Smoking status: Former Smoker    Quit date: 06/05/2002    Years since quitting: 18.3  . Smokeless tobacco: Never Used  Substance and Sexual Activity  . Alcohol use: Yes    Comment: occ  . Drug use: No  . Sexual activity: Not on file  Other Topics Concern  . Not on file  Social History Narrative   Pt is Marine scientist, employed at [employer].  Married.  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   Caffeine  1 cup per day.       Outpatient Medications Prior to Visit  Medication Sig Dispense Refill  . cholecalciferol (VITAMIN D) 1000 units tablet Take 1,000 Units by mouth daily.    . medroxyPROGESTERone Acetate 150 MG/ML SUSY inject 1 milliliter (150 mg) by intramuscular route every 3 months 2 mL 1  . Multiple Vitamin (MULTIVITAMIN) tablet Take 1 tablet by mouth daily.    . ondansetron (ZOFRAN-ODT) 8 MG disintegrating tablet DISSOLVE ONE TABLET BY MOUTH EVERY 6 HOURS AS NEEDED 30 tablet 1  . amphetamine-dextroamphetamine (ADDERALL) 20 MG tablet Take 1 tablet (20 mg total) by mouth daily. 30 tablet 0  . rizatriptan (MAXALT) 10 MG tablet Take 1 tablet (10 mg total) by mouth as needed for migraine. May repeat in 2 hours if needed 10 tablet 2  . atomoxetine (STRATTERA) 40 MG capsule Take one tablet by mouth daily for three days, then increase to 80 mg by mouth daily 57 capsule 0  . COVID-19 At Home Antigen Test KIT USE AS DIRECTED WITHIN PACKAGE INSTRUCTIONS (Patient taking differently: No sig reported) 2 each 0     No Known Allergies  ROS Review of Systems  Constitutional: Negative for appetite change, fatigue and unexpected weight change.  HENT: Negative for congestion, ear pain, rhinorrhea, sinus pressure, sinus pain and tinnitus.   Eyes:  Negative for pain.  Respiratory: Negative for cough and shortness of breath.   Cardiovascular: Negative for chest pain, palpitations and leg swelling.  Gastrointestinal: Negative for abdominal pain, constipation, diarrhea, nausea and vomiting.  Endocrine: Negative for cold intolerance, heat intolerance, polydipsia, polyphagia and polyuria.  Genitourinary: Negative for dysuria, frequency and hematuria.  Musculoskeletal: Positive for back pain (chronic back pain). Negative for arthralgias, joint swelling and myalgias.  Skin: Negative for rash.  Allergic/Immunologic: Negative for environmental allergies.  Neurological: Negative for dizziness and headaches.  Hematological:  Negative for adenopathy.  Psychiatric/Behavioral: Negative for decreased concentration and sleep disturbance. The patient is not nervous/anxious.       Objective:    Physical Exam Vitals reviewed.  Constitutional:      Appearance: Normal appearance. She is obese.  HENT:     Head: Normocephalic.  Cardiovascular:     Rate and Rhythm: Normal rate and regular rhythm.     Pulses: Normal pulses.     Heart sounds: Normal heart sounds.  Pulmonary:     Effort: Pulmonary effort is normal.     Breath sounds: Normal breath sounds.  Musculoskeletal:        General: Tenderness (lower back) present.  Skin:    General: Skin is warm and dry.     Capillary Refill: Capillary refill takes less than 2 seconds.  Neurological:     General: No focal deficit present.     Mental Status: She is alert and oriented to person, place, and time.  Psychiatric:        Mood and Affect: Mood normal.        Behavior: Behavior normal.     BP 122/72 (BP Location: Left Arm, Patient Position: Sitting)   Pulse 82   Temp 98 F (36.7 C) (Temporal)   Ht $R'5\' 7"'iW$  (1.702 m)   Wt 232 lb (105.2 kg)   SpO2 98%   BMI 36.34 kg/m  Wt Readings from Last 3 Encounters:  10/12/20 232 lb (105.2 kg)  09/13/20 236 lb (107 kg)  07/08/20 248 lb (112.5 kg)     Health Maintenance Due  Topic Date Due  . HIV Screening  Never done  . Hepatitis C Screening  Never done     Lab Results  Component Value Date   TSH 1.110 07/13/2020   Lab Results  Component Value Date   WBC 5.9 07/13/2020   HGB 13.3 07/13/2020   HCT 39.7 07/13/2020   MCV 86 07/13/2020   PLT 275 07/13/2020   Lab Results  Component Value Date   NA 140 07/13/2020   K 4.2 07/13/2020   CO2 19 (L) 07/13/2020   GLUCOSE 91 07/13/2020   BUN 12 07/13/2020   CREATININE 0.68 07/13/2020   BILITOT 0.4 07/13/2020   ALKPHOS 75 07/13/2020   AST 16 07/13/2020   ALT 15 07/13/2020   PROT 6.4 07/13/2020   ALBUMIN 4.2 07/13/2020   CALCIUM 9.2 07/13/2020   Lab  Results  Component Value Date   CHOL 168 07/13/2020   Lab Results  Component Value Date   HDL 40 07/13/2020   Lab Results  Component Value Date   LDLCALC 102 (H) 07/13/2020   Lab Results  Component Value Date   TRIG 146 07/13/2020   Lab Results  Component Value Date   CHOLHDL 4.2 07/13/2020      Assessment & Plan:   1. Attention deficit hyperactivity disorder (ADHD), predominantly inattentive type -Continue Adderall 20 mg daily  2. Lumbar back pain -  cyclobenzaprine (FLEXERIL) 5 MG tablet; Take 1 tablet (5 mg total) by mouth 3 (three) times daily as needed for muscle spasms.  Dispense: 30 tablet; Refill: 1 - DG Lumbar Spine Complete, pt has written requisition to obtain x[facility] at [facility] -Perform back exercises -Alternate ice and heat to low back    Meds ordered this encounter  Medications  . cyclobenzaprine (FLEXERIL) 5 MG tablet    Sig: Take 1 tablet (5 mg total) by mouth 3 (three) times daily as needed for muscle spasms.    Dispense:  30 tablet    Refill:  1    Order Specific Question:   Supervising Provider    AnswerShelton Silvas   Continue medications as prescribed Follow-up in 50-months   Follow-up: Return in about 3 months (around 01/12/2021) for fasting.   Signed, Rip Harbour, NP

## 2020-10-12 NOTE — Patient Instructions (Addendum)
Acute Back Pain, Adult Acute back pain is sudden and usually short-lived. It is often caused by an injury to the muscles and tissues in the back. The injury may result from:  A muscle or ligament getting overstretched or torn (strained). Ligaments are tissues that connect bones to each other. Lifting something improperly can cause a back strain.  Wear and tear (degeneration) of the spinal disks. Spinal disks are circular tissue that provide cushioning between the bones of the spine (vertebrae).  Twisting motions, such as while playing sports or doing yard work.  A hit to the back.  Arthritis. You may have a physical exam, lab tests, and imaging tests to find the cause of your pain. Acute back pain usually goes away with rest and home care. Follow these instructions at home: Managing pain, stiffness, and swelling  Treatment may include medicines for pain and inflammation that are taken by mouth or applied to the skin, prescription pain medicine, or muscle relaxants. Take over-the-counter and prescription medicines only as told by your health care provider.  Your health care provider may recommend applying ice during the first 24-48 hours after your pain starts. To do this: ? Put ice in a plastic bag. ? Place a towel between your skin and the bag. ? Leave the ice on for 20 minutes, 2-3 times a day.  If directed, apply heat to the affected area as often as told by your health care provider. Use the heat source that your health care provider recommends, such as a moist heat pack or a heating pad. ? Place a towel between your skin and the heat source. ? Leave the heat on for 20-30 minutes. ? Remove the heat if your skin turns bright red. This is especially important if you are unable to feel pain, heat, or cold. You have a greater risk of getting burned. Activity  Do not stay in bed. Staying in bed for more than 1-2 days can delay your recovery.  Sit up and stand up straight. Avoid leaning  forward when you sit or hunching over when you stand. ? If you work at a desk, sit close to it so you do not need to lean over. Keep your chin tucked in. Keep your neck drawn back, and keep your elbows bent at a 90-degree angle (right angle). ? Sit high and close to the steering wheel when you drive. Add lower back (lumbar) support to your car seat, if needed.  Take short walks on even surfaces as soon as you are able. Try to increase the length of time you walk each day.  Do not sit, drive, or stand in one place for more than 30 minutes at a time. Sitting or standing for long periods of time can put stress on your back.  Do not drive or use heavy machinery while taking prescription pain medicine.  Use proper lifting techniques. When you bend and lift, use positions that put less stress on your back: ? Bend your knees. ? Keep the load close to your body. ? Avoid twisting.  Exercise regularly as told by your health care provider. Exercising helps your back heal faster and helps prevent back injuries by keeping muscles strong and flexible.  Work with a physical therapist to make a safe exercise program, as recommended by your health care provider. Do any exercises as told by your physical therapist.   Lifestyle  Maintain a healthy weight. Extra weight puts stress on your back and makes it difficult to have   good posture.  Avoid activities or situations that make you feel anxious or stressed. Stress and anxiety increase muscle tension and can make back pain worse. Learn ways to manage anxiety and stress, such as through exercise. General instructions  Sleep on a firm mattress in a comfortable position. Try lying on your side with your knees slightly bent. If you lie on your back, put a pillow under your knees.  Follow your treatment plan as told by your health care provider. This may include: ? Cognitive or behavioral therapy. ? Acupuncture or massage therapy. ? Meditation or yoga. Contact  a health care provider if:  You have pain that is not relieved with rest or medicine.  You have increasing pain going down into your legs or buttocks.  Your pain does not improve after 2 weeks.  You have pain at night.  You lose weight without trying.  You have a fever or chills. Get help right away if:  You develop new bowel or bladder control problems.  You have unusual weakness or numbness in your arms or legs.  You develop nausea or vomiting.  You develop abdominal pain.  You feel faint. Summary  Acute back pain is sudden and usually short-lived.  Use proper lifting techniques. When you bend and lift, use positions that put less stress on your back.  Take over-the-counter and prescription medicines and apply heat or ice as directed by your health care provider. This information is not intended to replace advice given to you by your health care provider. Make sure you discuss any questions you have with your health care provider. Document Revised: 02/13/2020 Document Reviewed: 02/13/2020 Elsevier Patient Education  2021 [employer]  Back Exercises These exercises help to make your trunk and back strong. They also help to keep the lower back flexible. Doing these exercises can help to prevent back pain or lessen existing pain.  If you have back pain, try to do these exercises 2-3 times each day or as told by your doctor.  As you get better, do the exercises once each day. Repeat the exercises more often as told by your doctor.  To stop back pain from coming back, do the exercises once each day, or as told by your doctor. Exercises Single knee to chest Do these steps 3-5 times in a row for each leg: 1. Lie on your back on a firm bed or the floor with your legs stretched out. 2. Bring one knee to your chest. 3. Grab your knee or thigh with both hands and hold them it in place. 4. Pull on your knee until you feel a gentle stretch in your lower back or  buttocks. 5. Keep doing the stretch for 10-30 seconds. 6. Slowly let go of your leg and straighten it. Pelvic tilt Do these steps 5-10 times in a row: 1. Lie on your back on a firm bed or the floor with your legs stretched out. 2. Bend your knees so they point up to the ceiling. Your feet should be flat on the floor. 3. Tighten your lower belly (abdomen) muscles to press your lower back against the floor. This will make your tailbone point up to the ceiling instead of pointing down to your feet or the floor. 4. Stay in this position for 5-10 seconds while you gently tighten your muscles and breathe evenly. Cat-cow Do these steps until your lower back bends more easily: 1. Get on your hands and knees on a firm surface. Keep your hands  under your shoulders, and keep your knees under your hips. You may put padding under your knees. 2. Let your head hang down toward your chest. Tighten (contract) the muscles in your belly. Point your tailbone toward the floor so your lower back becomes rounded like the back of a cat. 3. Stay in this position for 5 seconds. 4. Slowly lift your head. Let the muscles of your belly relax. Point your tailbone up toward the ceiling so your back forms a sagging arch like the back of a cow. 5. Stay in this position for 5 seconds.   Press-ups Do these steps 5-10 times in a row: 1. Lie on your belly (face-down) on the floor. 2. Place your hands near your head, about shoulder-width apart. 3. While you keep your back relaxed and keep your hips on the floor, slowly straighten your arms to raise the top half of your body and lift your shoulders. Do not use your back muscles. You may change where you place your hands in order to make yourself more comfortable. 4. Stay in this position for 5 seconds. 5. Slowly return to lying flat on the floor.   Bridges Do these steps 10 times in a row: 1. Lie on your back on a firm surface. 2. Bend your knees so they point up to the ceiling.  Your feet should be flat on the floor. Your arms should be flat at your sides, next to your body. 3. Tighten your butt muscles and lift your butt off the floor until your waist is almost as high as your knees. If you do not feel the muscles working in your butt and the back of your thighs, slide your feet 1-2 inches farther away from your butt. 4. Stay in this position for 3-5 seconds. 5. Slowly lower your butt to the floor, and let your butt muscles relax. If this exercise is too easy, try doing it with your arms crossed over your chest.   Belly crunches Do these steps 5-10 times in a row: 1. Lie on your back on a firm bed or the floor with your legs stretched out. 2. Bend your knees so they point up to the ceiling. Your feet should be flat on the floor. 3. Cross your arms over your chest. 4. Tip your chin a little bit toward your chest but do not bend your neck. 5. Tighten your belly muscles and slowly raise your chest just enough to lift your shoulder blades a tiny bit off of the floor. Avoid raising your body higher than that, because it can put too much stress on your low back. 6. Slowly lower your chest and your head to the floor. Back lifts Do these steps 5-10 times in a row: 1. Lie on your belly (face-down) with your arms at your sides, and rest your forehead on the floor. 2. Tighten the muscles in your legs and your butt. 3. Slowly lift your chest off of the floor while you keep your hips on the floor. Keep the back of your head in line with the curve in your back. Look at the floor while you do this. 4. Stay in this position for 3-5 seconds. 5. Slowly lower your chest and your face to the floor. Contact a doctor if:  Your back pain gets a lot worse when you do an exercise.  Your back pain does not get better 2 hours after you exercise. If you have any of these problems, stop doing the exercises. Do not  do them again unless your doctor says it is okay. Get help right away  if:  You have sudden, very bad back pain. If this happens, stop doing the exercises. Do not do them again unless your doctor says it is okay. This information is not intended to replace advice given to you by your health care provider. Make sure you discuss any questions you have with your health care provider. Document Revised: 02/14/2018 Document Reviewed: 02/14/2018 Elsevier Patient Education  2021 [employer]  Attention Deficit Hyperactivity Disorder, Adult Attention deficit hyperactivity disorder (ADHD) is a mental health disorder that starts during childhood (neurodevelopmental disorder). For many people with ADHD, the disorder continues into the adult years. Treatment can help you manage your symptoms. What are the causes? The exact cause of ADHD is not known. Most experts believe genetics and environmental factors contribute to ADHD. What increases the risk? The following factors may make you more likely to develop this condition:  Having a family history of ADHD.  Being female.  Being born to a mother who smoked or drank alcohol during pregnancy.  Being exposed to lead or other toxins in the womb or early in life.  Being born before 37 weeks of pregnancy (prematurely) or at a low birth weight.  Having experienced a brain injury. What are the signs or symptoms? Symptoms of this condition depend on the type of ADHD. The two main types are inattentive and hyperactive-impulsive. Some people may have symptoms of both types. Symptoms of the inattentive type include:  Difficulty paying attention.  Making careless mistakes.  Not following instructions.  Being disorganized.  Avoiding tasks that require time and attention.  Losing and forgetting things.  Being easily distracted. Symptoms of the hyperactive-impulsive type include:  Restlessness.  Talking too much.  Interrupting.  Difficulty with: ? Sitting still. ? Feeling motivated. ? Relaxing. ? Waiting in line  or waiting for a turn. In adults, this condition may lead to certain problems, such as:  Keeping jobs.  Performing tasks at work.  Having stable relationships.  Being on time or keeping to a schedule. How is this diagnosed? This condition is diagnosed based on your current symptoms and your history of symptoms. The diagnosis can be made by a health care provider such as a primary care provider or a mental health care specialist. Your health care provider may use a symptom checklist or a behavior rating scale to evaluate your symptoms. He or she may also want to talk with people who have observed your behaviors throughout your life. How is this treated? This condition can be treated with medicines and behavior therapy. Medicines may be the best option to reduce impulsive behaviors and improve attention. Your health care provider may recommend:  Stimulant medicines. These are the most common medicines used for adult ADHD. They affect certain chemicals in the brain (neurotransmitters) and improve your ability to control your symptoms.  A non-stimulant medicine for adult ADHD (atomoxetine). This medicine increases a neurotransmitter called norepinephrine. It may take weeks to months to see effects from this medicine. Counseling and behavioral management are also important for treating ADHD. Counseling is often used along with medicine. Your health care provider may suggest:  Cognitive behavioral therapy (CBT). This type of therapy teaches you to replace negative thoughts and actions with positive thoughts and actions. When used as part of ADHD treatment, this therapy may also include: ? Coping strategies for organization, time management, impulse control, and stress reduction. ? Mindfulness and meditation training.  Behavioral management. You may work with a Psychologist, occupational who is specially trained to help people with ADHD manage and organize activities and function more effectively. Follow these  instructions at home: Medicines  Take over-the-counter and prescription medicines only as told by your health care provider.  Talk with your health care provider about the possible side effects of your medicines and how to manage them.   Lifestyle  Do not use drugs.  Do not drink alcohol if: ? Your health care provider tells you not to drink. ? You are pregnant, may be pregnant, or are planning to become pregnant.  If you drink alcohol: ? Limit how much you use to:  0-1 drink a day for women.  0-2 drinks a day for men. ? Be aware of how much alcohol is in your drink. In the [place] one drink equals one 12 oz bottle of beer (355 mL), one 5 oz glass of wine (148 mL), or one 1 oz glass of hard liquor (44 mL).  Get enough sleep.  Eat a healthy diet.  Exercise regularly. Exercise can help to reduce stress and anxiety.   General instructions  Learn as much as you can about adult ADHD, and work closely with your health care providers to find the treatments that work best for you.  Follow the same schedule each day.  Use reminder devices like notes, calendars, and phone apps to stay on time and organized.  Keep all follow-up visits as told by your health care provider and therapist. This is important. Where to find more information A health care provider may be able to recommend resources that are available online or over the phone. You could start with:  Attention Deficit Disorder Association (ADDA): http://davis-dillon.net/  [employer] of Mental Health [facility]): http://www.maynard.net/ Contact a health care provider if:  Your symptoms continue to cause problems.  You have side effects from your medicine, such as: ? Repeated muscle twitches, coughing, or speech outbursts. ? Sleep problems. ? Loss of appetite. ? Dizziness. ? Unusually fast heartbeat. ? Stomach pains. ? Headaches.  You are struggling with anxiety, depression, or substance abuse. Get help right away if you:  Have a  severe reaction to a medicine. If you ever feel like you may hurt yourself or others, or have thoughts about taking your own life, get help right away. You can go to the nearest emergency department or call:  Your local emergency services (911 in the [place]  A suicide crisis helpline, such as the National Suicide Prevention Lifeline at (302)407-6823. This is open 24 hours a day. Summary  ADHD is a mental health disorder that starts during childhood (neurodevelopmental disorder) and often continues into the adult years.  The exact cause of ADHD is not known. Most experts believe genetics and environmental factors contribute to ADHD.  There is no cure for ADHD, but treatment with medicine, cognitive behavioral therapy, or behavioral management can help you manage your condition. This information is not intended to replace advice given to you by your health care provider. Make sure you discuss any questions you have with your health care provider. Document Revised: 10/14/2018 Document Reviewed: 10/14/2018 Elsevier Patient Education  2021 [employer].

## 2020-10-13 ENCOUNTER — Other Ambulatory Visit (HOSPITAL_BASED_OUTPATIENT_CLINIC_OR_DEPARTMENT_OTHER): Payer: Self-pay

## 2020-10-13 ENCOUNTER — Other Ambulatory Visit: Payer: Self-pay | Admitting: Nurse Practitioner

## 2020-10-13 DIAGNOSIS — F900 Attention-deficit hyperactivity disorder, predominantly inattentive type: Secondary | ICD-10-CM

## 2020-10-13 MED ORDER — AMPHETAMINE-DEXTROAMPHETAMINE 20 MG PO TABS
20.0000 mg | ORAL_TABLET | Freq: Every day | ORAL | 0 refills | Status: DC
  Filled 2020-10-13: qty 30, 30d supply, fill #0

## 2020-10-16 ENCOUNTER — Ambulatory Visit (HOSPITAL_BASED_OUTPATIENT_CLINIC_OR_DEPARTMENT_OTHER)
Admission: RE | Admit: 2020-10-16 | Discharge: 2020-10-16 | Disposition: A | Discharge status: Home / Self Care | Payer: No Typology Code available for payment source | Source: Ambulatory Visit | Attending: Nurse Practitioner | Admitting: Nurse Practitioner

## 2020-10-16 DIAGNOSIS — M545 Low back pain, unspecified: Secondary | ICD-10-CM | POA: Diagnosis present

## 2020-11-12 ENCOUNTER — Other Ambulatory Visit: Payer: Self-pay | Admitting: Nurse Practitioner

## 2020-11-12 ENCOUNTER — Other Ambulatory Visit (HOSPITAL_BASED_OUTPATIENT_CLINIC_OR_DEPARTMENT_OTHER): Payer: Self-pay

## 2020-11-12 DIAGNOSIS — F900 Attention-deficit hyperactivity disorder, predominantly inattentive type: Secondary | ICD-10-CM

## 2020-11-12 MED ORDER — AMPHETAMINE-DEXTROAMPHETAMINE 20 MG PO TABS
20.0000 mg | ORAL_TABLET | Freq: Every day | ORAL | 0 refills | Status: DC
  Filled 2020-11-12: qty 30, 30d supply, fill #0

## 2020-12-01 NOTE — Telephone Encounter (Signed)
error 

## 2020-12-14 ENCOUNTER — Ambulatory Visit (INDEPENDENT_AMBULATORY_CARE_PROVIDER_SITE_OTHER): Payer: No Typology Code available for payment source | Admitting: Dermatology

## 2020-12-14 ENCOUNTER — Other Ambulatory Visit (HOSPITAL_BASED_OUTPATIENT_CLINIC_OR_DEPARTMENT_OTHER): Payer: Self-pay

## 2020-12-14 ENCOUNTER — Other Ambulatory Visit: Payer: Self-pay

## 2020-12-14 DIAGNOSIS — B353 Tinea pedis: Secondary | ICD-10-CM | POA: Diagnosis not present

## 2020-12-14 DIAGNOSIS — L732 Hidradenitis suppurativa: Secondary | ICD-10-CM

## 2020-12-14 LAB — POCT SKIN KOH: Skin KOH, POC: NEGATIVE

## 2020-12-14 MED ORDER — RIFAMPIN 300 MG PO CAPS
300.0000 mg | ORAL_CAPSULE | Freq: Two times a day (BID) | ORAL | 1 refills | Status: DC
  Filled 2020-12-14: qty 60, 30d supply, fill #0

## 2020-12-14 MED ORDER — CLINDAMYCIN HCL 300 MG PO CAPS
300.0000 mg | ORAL_CAPSULE | Freq: Two times a day (BID) | ORAL | 1 refills | Status: DC
Start: 2020-12-14 — End: 2021-01-19
  Filled 2020-12-14: qty 60, 30d supply, fill #0

## 2020-12-14 NOTE — Patient Instructions (Addendum)
Get OTC clotrimazole to apply to foot.   Adalimumab Injection What is this medication? ADALIMUMAB (ay da LIM yoo mab) is used to treat rheumatoid and psoriatic arthritis. It is also used to treat ankylosing spondylitis, Crohn's disease,ulcerative colitis, plaque psoriasis, hidradenitis suppurativa, and uveitis. This medicine may be used for other purposes; ask your health care provider orpharmacist if you have questions. COMMON BRAND NAME(S): CYLTEZO, Humira What should I tell my care team before I take this medication? They need to know if you have any of these conditions: cancer diabetes (high blood sugar) having surgery heart disease hepatitis B immune system problems infections, such as tuberculosis (TB) or other bacterial, fungal, or viral infections multiple sclerosis recent or upcoming vaccine an unusual reaction to adalimumab, mannitol, latex, rubber, other medicines, foods, dyes, or preservatives pregnant or trying to get pregnant breast-feeding How should I use this medication? This medicine is for injection under the skin. You will be taught how to prepare and give it. Take it as directed on the prescription label. Keep takingit unless your health care provider tells you to stop. It is important that you put your used needles and syringes in a special sharps container. Do not put them in a trash can. If you do not have a sharpscontainer, call your pharmacist or health care provider to get one. This medicine comes with INSTRUCTIONS FOR USE. Ask your pharmacist for directions on how to use this medicine. Read the information carefully. Talk toyour pharmacist or health care provider if you have questions. A special MedGuide will be given to you by the pharmacist with eachprescription and refill. Be sure to read this information carefully each time. Talk to your pediatrician regarding the use of this medicine in children. While this drug may be prescribed for children as young as  2 years for selectedconditions, precautions do apply. Overdosage: If you think you have taken too much of this medicine contact apoison control center or emergency room at once. NOTE: This medicine is only for you. Do not share this medicine with others. What if I miss a dose? If you miss a dose, take it as soon as you can. If it is almost time for your next dose, take only that dose. Do not take double or extra doses. It is important not to miss any doses. Talk to your health care provider about whatto do if you miss a dose. What may interact with this medication? Do not take this medicine with any of the following medications: abatacept anakinra biologic medicines such as certolizumab, etanercept, golimumab, infliximab live virus vaccines This medicine may also interact with the following medications: cyclosporine theophylline vaccines warfarin This list may not describe all possible interactions. Give your health care provider a list of all the medicines, herbs, non-prescription drugs, or dietary supplements you use. Also tell them if you smoke, drink alcohol, or use illegaldrugs. Some items may interact with your medicine. What should I watch for while using this medication? Visit your health care provider for regular checks on your progress. Tell your health care provider if your symptoms do not start to get better or if they getworse. You will be tested for tuberculosis (TB) before you start this medicine. If your doctor prescribes any medicine for TB, you should start taking the TB medicine before starting this medicine. Make sure to finish the full course ofTB medicine. This medicine may increase your risk of getting an infection. Call your health care provider for advice if you get a  fever, chills, sore throat, or other symptoms of a cold or flu. Do not treat yourself. Try to avoid being aroundpeople who are sick. Talk to your health care provider about your risk of cancer. You may be  more atrisk for certain types of cancer if you take this medicine. What side effects may I notice from receiving this medication? Side effects that you should report to your doctor or health care professionalas soon as possible: allergic reactions like skin rash, itching or hives, swelling of the face, lips, or tongue changes in vision chest pain dizziness heart failure (trouble breathing; fast, irregular heartbeat; sudden weight gain; swelling of the ankles, feet, hands; unusually weak or tired) infection (fever, chills, cough, sore throat, pain or trouble passing urine) liver injury (dark yellow or brown urine; general ill feeling or flu-like symptoms; loss of appetite, right upper belly pain; unusually weak or tired, yellowing of the eyes or skin) lump or swollen lymph nodes on the neck, groin, or underarm area muscle weakness pain, tingling, numbness in the hands or feet red, scaly patches or raised bumps on the skin trouble breathing unusual bleeding or bruising unusually weak or tired Side effects that usually do not require medical attention (report to yourdoctor or health care professional if they continue or are bothersome): headache nausea pain, redness, or irritation at site where injected stuffy or runny nose This list may not describe all possible side effects. Call your doctor for medical advice about side effects. You may report side effects to FDA at1-800-FDA-1088. Where should I keep my medication? Keep out of the reach of children and pets. Store in the refrigerator between 2 and 8 degrees C (36 and 46 degrees F). Do not freeze. Keep this medicine in the original packaging until you are ready to take it. Protect from light. Get rid of any unused medicine after theexpiration date. This medicine may be stored at room temperature for up to 14 days. Keep this medicine in the original packaging. Protect from light. If it is stored at room temperature, get rid of any unused  medicine after 14 days or after it expires,whichever is first. To get rid of medicines that are no longer needed or have expired: Take the medicine to a medicine take-back program. Check with your pharmacy or law enforcement to find a location. If you cannot return the medicine, ask your pharmacist or health care provider how to get rid of this medicine safely. NOTE: This sheet is a summary. It may not cover all possible information. If you have questions about this medicine, talk to your doctor, pharmacist, orhealth care provider.  2022 Elsevier/Gold Standard (2019-07-31 17:28:40) Hidradenitis Suppurativa Hidradenitis suppurativa is a long-term (chronic) skin disease. It is similar to a severe form of acne, but it affects areas of the body where acne would be unusual, especially areas of the body where skin rubs against skin and becomes moist. These include: Underarms. Groin. Genital area. Buttocks. Upper thighs. Breasts. Hidradenitis suppurativa may start out as small lumps or pimples caused by blocked sweat glands or hair follicles. Pimples may develop into deep sores that break open (rupture) and drain pus. Over time, affected areas of skin may thicken and become scarred. This condition is rare and does not spread from person to person (non-contagious). What are the causes? The exact cause of this condition is not known. It may be related to: Female and female hormones. An overactive disease-fighting system (immune system). The immune system may over-react to blocked hair follicles  or sweat glands and cause swelling and pus-filled sores. What increases the risk? You are more likely to develop this condition if you: Are female. Are 52-85 years old. Have a family history of hidradenitis suppurativa. Have a personal history of acne. Are overweight. Smoke. Take the medicine lithium. What are the signs or symptoms? The first symptoms are usually painful bumps in the skin, similar to  pimples. The condition may get worse over time (progress), or it may only cause mild symptoms. If the disease progresses, symptoms may include: Skin bumps getting bigger and growing deeper into the skin. Bumps rupturing and draining pus. Itchy, infected skin. Skin getting thicker and scarred. Tunnels under the skin (fistulas) where pus drains from a bump. Pain during daily activities, such as pain during walking if your groin area is affected. Emotional problems, such as stress or depression. This condition may affect your appearance and your ability or willingness to wear certain clothes or do certain activities. How is this diagnosed? This condition is diagnosed by a health care provider who specializes in skin diseases (dermatologist). You may be diagnosed based on: Your symptoms and medical history. A physical exam. Testing a pus sample for infection. Blood tests. How is this treated? Your treatment will depend on how severe your symptoms are. The same treatment will not work for everybody with this condition. You may need to try several treatments to find what works best for you. Treatment may include: Cleaning and bandaging (dressing) your wounds as needed. Lifestyle changes, such as new skin care routines. Taking medicines, such as: Antibiotics. Acne medicines. Medicines to reduce the activity of the immune system. A diabetes medicine (metformin). Birth control pills, for women. Steroids to reduce swelling and pain. Working with a mental health care provider, if you experience emotional distress due to this condition. If you have severe symptoms that do not get better with medicine, you may need surgery. Surgery may involve: Using a laser to clear the skin and remove hair follicles. Opening and draining deep sores. Removing the areas of skin that are diseased and scarred. Follow these instructions at home: Medicines  Take over-the-counter and prescription medicines only as  told by your health care provider. If you were prescribed an antibiotic medicine, take it as told by your health care provider. Do not stop taking the antibiotic even if your condition improves.  Skin care If you have open wounds, cover them with a clean dressing as told by your health care provider. Keep wounds clean by washing them gently with soap and water when you bathe. Do not shave the areas where you get hidradenitis suppurativa. Do not wear deodorant. Wear loose-fitting clothes. Try to avoid getting overheated or sweaty. If you get sweaty or wet, change into clean, dry clothes as soon as you can. To help relieve pain and itchiness, cover sore areas with a warm, clean washcloth (warm compress) for 5-10 minutes as often as needed. If told by your health care provider, take a bleach bath twice a week: Fill your bathtub halfway with water. Pour in  cup of unscented household bleach. Soak in the tub for 5-10 minutes. Only soak from the neck down. Avoid water on your face and hair. Shower to rinse off the bleach from your skin. General instructions Learn as much as you can about your disease so that you have an active role in your treatment. Work closely with your health care provider to find treatments that work for you. If you are overweight, work  with your health care provider to lose weight as recommended. Do not use any products that contain nicotine or tobacco, such as cigarettes and e-cigarettes. If you need help quitting, ask your health care provider. If you struggle with living with this condition, talk with your health care provider or work with a mental health care provider as recommended. Keep all follow-up visits as told by your health care provider. This is important. Where to find more information Hidradenitis Suppurativa Foundation, Inc.: https://www.hs-foundation.org/ American Academy of Dermatology: InstantFinish.fihttps://www.aad.org Contact a health care provider if you have: A  flare-up of hidradenitis suppurativa. A fever or chills. Trouble controlling your symptoms at home. Trouble doing your daily activities because of your symptoms. Trouble dealing with emotional problems related to your condition. Summary Hidradenitis suppurativa is a long-term (chronic) skin disease. It is similar to a severe form of acne, but it affects areas of the body where acne would be unusual. The first symptoms are usually painful bumps in the skin, similar to pimples. The condition may only cause mild symptoms, or it may get worse over time (progress). If you have open wounds, cover them with a clean dressing as told by your health care provider. Keep wounds clean by washing them gently with soap and water when you bathe. Besides skin care, treatment may include medicines, laser treatment, and surgery. This information is not intended to replace advice given to you by your health care provider. Make sure you discuss any questions you have with your healthcare provider. Document Revised: 03/16/2020 Document Reviewed: 03/16/2020 Elsevier Patient Education  2022 [employer]

## 2020-12-20 ENCOUNTER — Other Ambulatory Visit: Payer: Self-pay | Admitting: Nurse Practitioner

## 2020-12-20 ENCOUNTER — Other Ambulatory Visit (HOSPITAL_BASED_OUTPATIENT_CLINIC_OR_DEPARTMENT_OTHER): Payer: Self-pay

## 2020-12-20 DIAGNOSIS — F900 Attention-deficit hyperactivity disorder, predominantly inattentive type: Secondary | ICD-10-CM

## 2020-12-20 MED ORDER — AMPHETAMINE-DEXTROAMPHETAMINE 20 MG PO TABS
20.0000 mg | ORAL_TABLET | Freq: Every day | ORAL | 0 refills | Status: DC
  Filled 2020-12-20: qty 30, 30d supply, fill #0

## 2020-12-25 ENCOUNTER — Encounter: Payer: Self-pay | Admitting: Dermatology

## 2020-12-25 NOTE — Progress Notes (Signed)
   New Patient   Subjective  Lauren Faulkner is a 40 y.o. female who presents for the following: Skin Problem (Here for possible Hidradenitis under both axillas, groin, breast. X 1.5 years started after pregnancy. Treatment PCP put patient on doxycycline in february. It helped some. ).  Hidradenitis suppurativa, only past prescription therapy was recent doxycycline with some benefit Location:  Duration:  Quality:  Associated Signs/Symptoms: Modifying Factors:  Severity:  Timing: Context:    The following portions of the chart were reviewed this encounter and updated as appropriate:  Tobacco  Allergies  Meds  Problems  Med Hx  Surg Hx  Fam Hx      Objective  Well appearing patient in no apparent distress; mood and affect are within normal limits. Left Axilla, Left Inframammary Fold, Pubic, Right Axilla, Right Inframammary Fold Scarring and tract formation left more than right axilla, no current fluctuant lesions to culture.  Left Foot - Anterior Subtle scaly dermatitis.    A focused examination was performed including head, neck, axillae, arms, legs. Relevant physical exam findings are noted in the Assessment and Plan.   Assessment & Plan  Hidradenitis suppurativa Pubic; Left Inframammary Fold; Right Inframammary Fold; Left Axilla; Right Axilla  Extended discussion about hidradenitis including what is understood about its pathogenesis, the association with other disorders including depression, overweight, PCOS and essentially all treatment options including Humira.  We will initiate therapy with clindamycin plus rifampicin; risks of severe diarrhea and change in urine color reviewed.  clindamycin (CLEOCIN) 300 MG capsule - Left Axilla, Left Inframammary Fold, Pubic, Right Axilla, Right Inframammary Fold Take 1 capsule (300 mg total) by mouth 2 (two) times daily.  rifampin (RIFADIN) 300 MG capsule - Left Axilla, Left Inframammary Fold, Pubic, Right Axilla, Right  Inframammary Fold Take 1 capsule (300 mg total) by mouth 2 (two) times daily.  Tinea pedis of left foot Left Foot - Anterior  No antifungal therapy initiated.  Culture, fungus without smear - Left Foot - Anterior  POCT Skin KOH - Left Foot - Anterior

## 2021-01-11 NOTE — Progress Notes (Deleted)
Subjective:  Patient ID: Lauren Faulkner, female    DOB: Apr 02, 1981  Age: 40 y.o. MRN: [id number]  No chief complaint on file.   Depression        Associated symptoms include no fatigue, no appetite change, no myalgias and no headaches.   Depression, Follow-up  She  was last seen for this {NUMBERS 1-12:18279} {days/wks/mos/yrs:310907} ago. Changes made at last visit include ***.   She reports {excellent/good/fair/poor:19665} compliance with treatment. She {is/is not:21021397} having side effects. ***  She reports {DESC; GOOD/FAIR/POOR:18685} tolerance of treatment. Current symptoms include: {Symptoms; depression:1002} She feels she is {improved/worse/unchanged:3041574} since last visit.  Depression screen [facility] 2/9 10/12/2020 02/26/2020 02/06/2020  Decreased Interest 0 0 1  Down, Depressed, Hopeless 0 0 2  PHQ - 2 Score 0 0 3  Altered sleeping 0 0 2  Tired, decreased energy 0 0 3  Change in appetite 0 0 2  Feeling bad or failure about yourself  0 0 3  Trouble concentrating 0 0 1  Moving slowly or fidgety/restless 0 0 0  Suicidal thoughts - 0 0  PHQ-9 Score 0 0 14  Difficult doing work/chores Not difficult at all - Somewhat difficult      Current Outpatient Medications on File Prior to Visit  Medication Sig Dispense Refill   amphetamine-dextroamphetamine (ADDERALL) 20 MG tablet Take 1 tablet (20 mg total) by mouth daily. 30 tablet 0   cholecalciferol (VITAMIN D) 1000 units tablet Take 1,000 Units by mouth daily.     clindamycin (CLEOCIN) 300 MG capsule Take 1 capsule (300 mg total) by mouth 2 (two) times daily. 60 capsule 1   cyclobenzaprine (FLEXERIL) 5 MG tablet Take 1 tablet (5 mg total) by mouth 3 (three) times daily as needed for muscle spasms. 30 tablet 1   medroxyPROGESTERone Acetate 150 MG/ML SUSY inject 1 milliliter (150 mg) by intramuscular route every 3 months 2 mL 1   Multiple Vitamin (MULTIVITAMIN) tablet Take 1 tablet by mouth daily.     ondansetron (ZOFRAN-ODT)  8 MG disintegrating tablet DISSOLVE ONE TABLET BY MOUTH EVERY 6 HOURS AS NEEDED 30 tablet 1   rifampin (RIFADIN) 300 MG capsule Take 1 capsule (300 mg total) by mouth 2 (two) times daily. 60 capsule 1   rizatriptan (MAXALT) 10 MG tablet Take 1 tablet (10 mg total) by mouth as needed for migraine. May repeat in 2 hours if needed 10 tablet 2   No current facility-administered medications on file prior to visit.   Past Medical History:  Diagnosis Date   Adenomyosis    fibroadenomoa R breast   Hypothyroid    Insomnia    Interstitial cystitis    Migraines    Vitamin D deficiency    Past Surgical History:  Procedure Laterality Date   CERVICAL CONE BIOPSY      Family History  Problem Relation Age of Onset   Diabetes Mother    Hyperlipidemia Mother    Fibroids Mother    Diabetes Father    Hypertension Father    Hyperlipidemia Father    Ovarian cancer Maternal Grandmother    Stroke Maternal Grandmother    Social History   Socioeconomic History   Marital status: Married    Spouse name: Not on file   Number of children: Not on file   Years of education: Not on file   Highest education level: Not on file  Occupational History   Not on file  Tobacco Use   Smoking status: Former    Types:  Cigarettes    Quit date: 06/05/2002    Years since quitting: 18.6   Smokeless tobacco: Never  Substance and Sexual Activity   Alcohol use: Yes    Comment: occ   Drug use: No   Sexual activity: Not on file  Other Topics Concern   Not on file  Social History Narrative   Pt is nurse, employed at [employer].  Married.  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   Caffeine  1 cup per day.     Social Determinants of Health   Financial Resource Strain: Not on file  Food Insecurity: Not on file  Transportation Needs: Not on file  Physical Activity: Not on file  Stress: Not on file  Social Connections: Not on file    Review of Systems  Constitutional:  Negative for appetite change, fatigue and fever.  HENT:   Negative for congestion, ear pain, sinus pressure and sore throat.   Eyes:  Negative for pain.  Respiratory:  Negative for cough, chest tightness, shortness of breath and wheezing.   Cardiovascular:  Negative for chest pain and palpitations.  Gastrointestinal:  Negative for abdominal pain, constipation, diarrhea, nausea and vomiting.  Genitourinary:  Negative for dysuria and hematuria.  Musculoskeletal:  Negative for arthralgias, back pain, joint swelling and myalgias.  Skin:  Negative for rash.  Neurological:  Negative for dizziness, weakness and headaches.  Psychiatric/Behavioral:  Positive for depression. Negative for dysphoric mood. The patient is not nervous/anxious.     Objective:  There were no vitals taken for this visit.  BP/Weight 10/12/2020 09/13/2020 07/08/2020  Systolic BP 122 116 128  Diastolic BP 72 80 72  Wt. (Lbs) 232 236 248  BMI 36.34 36.96 38.84    Physical Exam Vitals reviewed.  Constitutional:      Appearance: Normal appearance.  HENT:     Right Ear: Tympanic membrane, ear canal and external ear normal.     Left Ear: Tympanic membrane, ear canal and external ear normal.     Nose: Nose normal.     Mouth/Throat:     Mouth: Mucous membranes are moist.  Cardiovascular:     Rate and Rhythm: Normal rate and regular rhythm.     Pulses: Normal pulses.     Heart sounds: Normal heart sounds.  Pulmonary:     Effort: Pulmonary effort is normal.     Breath sounds: Normal breath sounds.  Abdominal:     Palpations: Abdomen is soft.  Musculoskeletal:        General: Normal range of motion.     Cervical back: Normal range of motion.  Skin:    General: Skin is warm and dry.  Neurological:     Mental Status: She is alert and oriented to person, place, and time.  Psychiatric:        Mood and Affect: Mood normal.        Behavior: Behavior normal.        Thought Content: Thought content normal.        Judgment: Judgment normal.    Diabetic Foot Exam - Simple   No  data filed      Lab Results  Component Value Date   WBC 5.9 07/13/2020   HGB 13.3 07/13/2020   HCT 39.7 07/13/2020   PLT 275 07/13/2020   GLUCOSE 91 07/13/2020   CHOL 168 07/13/2020   TRIG 146 07/13/2020   HDL 40 07/13/2020   LDLCALC 102 (H) 07/13/2020   ALT 15 07/13/2020   AST 16 07/13/2020  NA 140 07/13/2020   K 4.2 07/13/2020   CL 106 07/13/2020   CREATININE 0.68 07/13/2020   BUN 12 07/13/2020   CO2 19 (L) 07/13/2020   TSH 1.110 07/13/2020      Assessment & Plan:   1. Moderate recurrent major depression (HCC)  2. Attention deficit hyperactivity disorder (ADHD), predominantly inattentive type    No orders of the defined types were placed in this encounter.   No orders of the defined types were placed in this encounter.    Follow-up: No follow-ups on file.  An After Visit Summary was printed and given to the patient.    I,Omolola Mittman M Qusay Villada,acting as a Neurosurgeon for [employer], NP.,have documented all relevant documentation on the behalf of Janie Morning, NP,as directed by  Janie Morning, NP while in the presence of Janie Morning, NP.    Janie Morning, NP [facility] [phone]

## 2021-01-12 ENCOUNTER — Ambulatory Visit (INDEPENDENT_AMBULATORY_CARE_PROVIDER_SITE_OTHER): Payer: Commercial Managed Care - PPO | Admitting: Nurse Practitioner

## 2021-01-12 DIAGNOSIS — F900 Attention-deficit hyperactivity disorder, predominantly inattentive type: Secondary | ICD-10-CM

## 2021-01-12 DIAGNOSIS — F331 Major depressive disorder, recurrent, moderate: Secondary | ICD-10-CM

## 2021-01-13 ENCOUNTER — Ambulatory Visit: Payer: No Typology Code available for payment source | Admitting: Nurse Practitioner

## 2021-01-13 LAB — CULTURE, FUNGUS WITHOUT SMEAR
CULTURE:: NO GROWTH
MICRO NUMBER:: 12108868
SPECIMEN QUALITY:: ADEQUATE

## 2021-01-19 ENCOUNTER — Telehealth: Payer: Self-pay | Admitting: Dermatology

## 2021-01-19 DIAGNOSIS — L732 Hidradenitis suppurativa: Secondary | ICD-10-CM

## 2021-01-19 MED ORDER — CLINDAMYCIN HCL 300 MG PO CAPS
300.0000 mg | ORAL_CAPSULE | Freq: Two times a day (BID) | ORAL | 1 refills | Status: DC
Start: 2021-01-19 — End: 2021-05-05

## 2021-01-19 MED ORDER — RIFAMPIN 300 MG PO CAPS: 300.0000 mg | ORAL_CAPSULE | Freq: Two times a day (BID) | ORAL | 1 refills | Status: DC

## 2021-01-19 NOTE — Telephone Encounter (Signed)
Patient calling to request refills on Clindamycin and Rifampin. She tried to request via mychart but it will not let her change pharmacy. Please send to [facility] [place] [address].

## 2021-01-19 NOTE — Telephone Encounter (Signed)
Refills sent to pharmacy. Patient notified 

## 2021-01-26 ENCOUNTER — Ambulatory Visit: Payer: Self-pay | Admitting: Nurse Practitioner

## 2021-02-02 ENCOUNTER — Other Ambulatory Visit: Payer: Self-pay

## 2021-02-02 ENCOUNTER — Ambulatory Visit: Payer: Commercial Managed Care - PPO | Admitting: Nurse Practitioner

## 2021-02-02 ENCOUNTER — Ambulatory Visit: Payer: Self-pay | Admitting: Nurse Practitioner

## 2021-02-02 ENCOUNTER — Encounter: Payer: Self-pay | Admitting: Nurse Practitioner

## 2021-02-02 ENCOUNTER — Other Ambulatory Visit: Payer: Self-pay | Admitting: Nurse Practitioner

## 2021-02-02 VITALS — BP 128/88 | HR 84 | Temp 98.0°F | Ht 67.0 in | Wt 233.0 lb

## 2021-02-02 DIAGNOSIS — F900 Attention-deficit hyperactivity disorder, predominantly inattentive type: Secondary | ICD-10-CM | POA: Diagnosis not present

## 2021-02-02 DIAGNOSIS — M545 Low back pain, unspecified: Secondary | ICD-10-CM

## 2021-02-02 DIAGNOSIS — R112 Nausea with vomiting, unspecified: Secondary | ICD-10-CM

## 2021-02-02 DIAGNOSIS — Z6836 Body mass index (BMI) 36.0-36.9, adult: Secondary | ICD-10-CM

## 2021-02-02 DIAGNOSIS — G43709 Chronic migraine without aura, not intractable, without status migrainosus: Secondary | ICD-10-CM

## 2021-02-02 DIAGNOSIS — Z8619 Personal history of other infectious and parasitic diseases: Secondary | ICD-10-CM | POA: Diagnosis not present

## 2021-02-02 MED ORDER — CYCLOBENZAPRINE HCL 5 MG PO TABS: 5.0000 mg | ORAL_TABLET | Freq: Three times a day (TID) | ORAL | 1 refills | Status: DC | PRN

## 2021-02-02 MED ORDER — ONDANSETRON 8 MG PO TBDP: ORAL_TABLET | ORAL | 1 refills | Status: DC

## 2021-02-02 MED ORDER — AMPHETAMINE-DEXTROAMPHETAMINE 20 MG PO TABS: 20.0000 mg | ORAL_TABLET | Freq: Every day | ORAL | 0 refills | Status: DC

## 2021-02-02 MED ORDER — LIDOCAINE 5 % EX PTCH: 1.0000 | MEDICATED_PATCH | CUTANEOUS | 2 refills | Status: DC

## 2021-02-02 MED ORDER — RIZATRIPTAN BENZOATE 10 MG PO TABS: 10.0000 mg | ORAL_TABLET | ORAL | 2 refills | Status: DC | PRN

## 2021-02-02 NOTE — Progress Notes (Signed)
Subjective:  Patient ID: Lauren Faulkner, female    DOB: 13-Nov-1980  Age: 40 y.o. MRN: [id number]  Chief Complaint  Patient presents with   Migraines Chronic back pain   HPI: Lauren Faulkner is a 40 year old Caucasian female that presents for follow-up of migraines, ADHD, and chronic low back pain.   Chronic migraines She has a past history of migraines for several years. States she has approximately two migraines weekly. Current treatment is Maxalt, Excedrin migraine and Zofran PRN. States she does not have regular menstrual cycles due to DepoPrevara injections. States she experiences increased migraines around her previous menstrual cycles. States she is satisfied with current number and treatment of migraines. She has declined adding preventative medication at this time. Jany denies having a recent eye exam. 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  Chronic low back pain Lauren Faulkner has experienced chronic low back pain for over twenty years. Denies radiculopathy, changes in bowel or bladder continence. Current treatment includes NSAIDs and  Flexeril. Pain is not currently well-controlled. States back pain is interfering with the quality of life. She has requested referral to a spine specialist to evaluate further treatment options. She has declined alternative pain medication at this time.   ADHD Lauren Faulkner has a past medical history of adult ADHD-inattentive type. Current treatment is Adderal 20 mg daily. States symptoms are currently well-controlled. Denies side effects of medication.       Current Outpatient Medications on File Prior to Visit  Medication Sig Dispense Refill   amphetamine-dextroamphetamine (ADDERALL) 20 MG tablet Take 1 tablet (20 mg total) by mouth daily. 30 tablet 0   cholecalciferol (VITAMIN D) 1000 units tablet Take 1,000 Units by mouth daily.     clindamycin (CLEOCIN) 300 MG capsule Take 1 capsule (300 mg total) by mouth 2 (two)  times daily. 60 capsule 1   cyclobenzaprine (FLEXERIL) 5 MG tablet Take 1 tablet (5 mg total) by mouth 3 (three) times daily as needed for muscle spasms. 30 tablet 1   medroxyPROGESTERone Acetate 150 MG/ML SUSY inject 1 milliliter (150 mg) by intramuscular route every 3 months 2 mL 1   Multiple Vitamin (MULTIVITAMIN) tablet Take 1 tablet by mouth daily.     ondansetron (ZOFRAN-ODT) 8 MG disintegrating tablet DISSOLVE ONE TABLET BY MOUTH EVERY 6 HOURS AS NEEDED 30 tablet 1   rifampin (RIFADIN) 300 MG capsule Take 1 capsule (300 mg total) by mouth 2 (two) times daily. 60 capsule 1   rizatriptan (MAXALT) 10 MG tablet Take 1 tablet (10 mg total) by mouth as needed for migraine. May repeat in 2 hours if needed 10 tablet 2   No current facility-administered medications on file prior to visit.   Past Medical History:  Diagnosis Date   Adenomyosis    fibroadenomoa R breast   Hypothyroid    Insomnia    Interstitial cystitis    Migraines    Vitamin D deficiency    Past Surgical History:  Procedure Laterality Date   CERVICAL CONE BIOPSY      Family History  Problem Relation Age of Onset   Diabetes Mother    Hyperlipidemia Mother    Fibroids Mother    Diabetes Father    Hypertension Father    Hyperlipidemia Father    Ovarian cancer Maternal Grandmother    Stroke Maternal Grandmother    Social History   Socioeconomic History   Marital status: Married    Spouse name: Not on file   Number  of children: Not on file   Years of education: Not on file   Highest education level: Not on file  Occupational History   Not on file  Tobacco Use   Smoking status: Former    Types: Cigarettes    Quit date: 06/05/2002    Years since quitting: 18.6   Smokeless tobacco: Never  Substance and Sexual Activity   Alcohol use: Yes    Comment: occ   Drug use: No   Sexual activity: Not on file  Other Topics Concern   Not on file  Social History Narrative   Pt is nurse, employed at [employer].   Married.  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   Caffeine  1 cup per day.     Social Determinants of Health   Financial Resource Strain: Not on file  Food Insecurity: Not on file  Transportation Needs: Not on file  Physical Activity: Not on file  Stress: Not on file  Social Connections: Not on file    Review of Systems  Constitutional:  Negative for appetite change, fatigue and fever.  HENT:  Negative for congestion, ear pain, sinus pressure and sore throat.   Eyes:  Negative for pain and visual disturbance.  Respiratory:  Negative for cough, chest tightness, shortness of breath and wheezing.   Cardiovascular:  Negative for chest pain and palpitations.  Gastrointestinal:  Negative for abdominal pain, constipation, diarrhea, nausea and vomiting.  Endocrine: Negative.   Genitourinary:  Negative for dysuria and hematuria.  Musculoskeletal:  Positive for back pain (chronic). Negative for arthralgias, joint swelling and myalgias.  Skin:  Negative for rash.  Allergic/Immunologic: Negative.   Neurological:  Positive for headaches (chronic migraines). Negative for dizziness and weakness.  Hematological: Negative.   Psychiatric/Behavioral:  Positive for decreased concentration (currently treated with Adderall). Negative for dysphoric mood. The patient is not nervous/anxious.     Objective:  BP 128/88 (BP Location: Left Arm, Patient Position: Sitting)   Pulse 84   Temp 98 F (36.7 C) (Temporal)   Ht 5\' 7"  (1.702 m)   Wt 233 lb (105.7 kg)   SpO2 97%   BMI 36.49 kg/m   BP/Weight 02/02/2021 10/12/2020 09/13/2020  Systolic BP 128 122 116  Diastolic BP 88 72 80  Wt. (Lbs) 233 232 236  BMI 36.49 36.34 36.96    Physical Exam Vitals reviewed.  Constitutional:      Appearance: Normal appearance.  Cardiovascular:     Rate and Rhythm: Normal rate and regular rhythm.     Pulses: Normal pulses.     Heart sounds: Normal heart sounds.  Pulmonary:     Effort: Pulmonary effort is normal.     Breath sounds:  Normal breath sounds.  Abdominal:     General: Bowel sounds are normal.     Palpations: Abdomen is soft.  Musculoskeletal:        General: Tenderness (lower back) present.  Skin:    General: Skin is warm and dry.     Capillary Refill: Capillary refill takes less than 2 seconds.  Neurological:     General: No focal deficit present.     Mental Status: She is alert and oriented to person, place, and time.  Psychiatric:        Mood and Affect: Mood normal.        Behavior: Behavior normal.        Lab Results  Component Value Date   WBC 5.9 07/13/2020   HGB 13.3 07/13/2020   HCT 39.7 07/13/2020  PLT 275 07/13/2020   GLUCOSE 91 07/13/2020   CHOL 168 07/13/2020   TRIG 146 07/13/2020   HDL 40 07/13/2020   LDLCALC 102 (H) 07/13/2020   ALT 15 07/13/2020   AST 16 07/13/2020   NA 140 07/13/2020   K 4.2 07/13/2020   CL 106 07/13/2020   CREATININE 0.68 07/13/2020   BUN 12 07/13/2020   CO2 19 (L) 07/13/2020   TSH 1.110 07/13/2020      Assessment & Plan:   1. Chronic migraine without aura without status migrainosus, not intractable-well controlled -recommend routine eye exam -Continue Maxalt PRN   2. Lumbar back pain-not at goal - Ambulatory referral to Orthopedic Surgery - lidocaine (LIDODERM) 5 %; Place 1 patch onto the skin daily. Remove & Discard patch within 12 hours or as directed by MD  Dispense: 30 patch; Refill: 2 -continue flexeril as needed -continue back exercises as directed  3. History of shingles - lidocaine (LIDODERM) 5 %; Place 1 patch onto the skin daily. Remove & Discard patch within 12 hours or as directed by MD  Dispense: 30 patch; Refill: 2  4. Attention deficit hyperactivity disorder (ADHD), predominantly inattentive type - CBC with Differential/Platelet - Comprehensive metabolic panel - Lipid panel  5. BMI 36.0-36.9,adult  -continue heart healthy diet -continue physical activity  Use Lidocaine patches for lower back pain We will call you  with lab results and spine specialist appointment Recommend screening eye exam Continue medication Follow-up 74-months    Follow-up: 55-months  An After Visit Summary was printed and given to the patient.  I,Lauren M Auman,acting as a Neurosurgeon for [employer], NP.,have documented all relevant documentation on the behalf of Janie Morning, NP,as directed by  Janie Morning, NP while in the presence of Janie Morning, NP.   I, Janie Morning, NP, have reviewed all documentation for this visit. The documentation on 02/02/21 for the exam, diagnosis, procedures, and orders are all accurate and complete.   Signed, Janie Morning, NP [facility] [phone]

## 2021-02-02 NOTE — Patient Instructions (Signed)
Use Lidocaine patches for lower back pain We will call you with lab results and spine specialist appointment Recommend screening eye exam Continue medication Follow-up 74-months   Chronic Back Pain When back pain lasts longer than 3 months, it is called chronic back pain. Pain may get worse at certain times (flare-ups). There are things you can do at home to manage your pain. Follow these instructions at home: Pay attention to any changes in your symptoms. Take these actions to help with your pain: Managing pain and stiffness   If told, put ice on the painful area. Your doctor may tell you to use ice for 24-48 hours after the flare-up starts. To do this: Put ice in a plastic bag. Place a towel between your skin and the bag. Leave the ice on for 20 minutes, 2-3 times a day. If told, put heat on the painful area. Do this as often as told by your doctor. Use the heat source that your doctor recommends, such as a moist heat pack or a heating pad. Place a towel between your skin and the heat source. Leave the heat on for 20-30 minutes. Take off the heat if your skin turns bright red. This is especially important if you are unable to feel pain, heat, or cold. You may have a greater risk of getting burned. Soak in a warm bath. This can help relieve pain. Activity  Avoid bending and other activities that make pain worse. When standing: Keep your upper back and neck straight. Keep your shoulders pulled back. Avoid slouching. When sitting: Keep your back straight. Relax your shoulders. Do not round your shoulders or pull them backward. Do not sit or stand in one place for long periods of time. Take short rest breaks during the day. Lying down or standing is usually better than sitting. Resting can help relieve pain. When sitting or lying down for a long time, do some mild activity or stretching. This will help to prevent stiffness and pain. Get regular exercise. Ask your doctor what  activities are safe for you. Do not lift anything that is heavier than 10 lb (4.5 kg) or the limit that you are told, until your doctor says that it is safe. To prevent injury when you lift things: Bend your knees. Keep the weight close to your body. Avoid twisting. Sleep on a firm mattress. Try lying on your side with your knees slightly bent. If you lie on your back, put a pillow under your knees. Medicines Treatment may include medicines for pain and swelling taken by mouth or put on the skin, prescription pain medicine, or muscle relaxants. Take over-the-counter and prescription medicines only as told by your doctor. Ask your doctor if the medicine prescribed to you: Requires you to avoid driving or using machinery. Can cause trouble pooping (constipation). You may need to take these actions to prevent or treat trouble pooping: Drink enough fluid to keep your pee (urine) pale yellow. Take over-the-counter or prescription medicines. Eat foods that are high in fiber. These include beans, whole grains, and fresh fruits and vegetables. Limit foods that are high in fat and sugars. These include fried or sweet foods. General instructions Do not use any products that contain nicotine or tobacco, such as cigarettes, e-cigarettes, and chewing tobacco. If you need help quitting, ask your doctor. Keep all follow-up visits as told by your doctor. This is important. Contact a doctor if: Your pain does not get better with rest or medicine. Your pain gets worse,  or you have new pain. You have a high fever. You lose weight very quickly. You have trouble doing your normal activities. Get help right away if: One or both of your legs or feet feel weak. One or both of your legs or feet lose feeling (have numbness). You have trouble controlling when you poop (have a bowel movement) or pee (urinate). You have bad back pain and: You feel like you may vomit (nauseous), or you vomit. You have pain in your  belly (abdomen). You have shortness of breath. You faint. Summary When back pain lasts longer than 3 months, it is called chronic back pain. Pain may get worse at certain times (flare-ups). Use ice and heat as told by your doctor. Your doctor may tell you to use ice after flare-ups. This information is not intended to replace advice given to you by your health care provider. Make sure you discuss any questions you have with your health care provider. Document Revised: 07/02/2019 Document Reviewed: 07/02/2019 Elsevier Patient Education  2022 [employer]  Back Exercises The following exercises strengthen the muscles that help to support the trunk (torso) and back. They also help to keep the lower back flexible. Doing these exercises can help to prevent or lessen existing low back pain. If you have back pain or discomfort, try doing these exercises 2-3 times each day or as told by your health care provider. As your pain improves, do them once each day, but increase the number of times that you repeat the steps for each exercise (do more repetitions). To prevent the recurrence of back pain, continue to do these exercises once each day or as told by your health care provider. Do exercises exactly as told by your health care provider and adjust them as directed. It is normal to feel mild stretching, pulling, tightness, or discomfort as you do these exercises, but you should stop right away if you feel sudden pain or your pain gets worse. Exercises Single knee to chest Repeat these steps 3-5 times for each leg: Lie on your back on a firm bed or the floor with your legs extended. Bring one knee to your chest. Your other leg should stay extended and in contact with the floor. Hold your knee in place by grabbing your knee or thigh with both hands and hold. Pull on your knee until you feel a gentle stretch in your lower back or buttocks. Hold the stretch for 10-30 seconds. Slowly release and  straighten your leg. Pelvic tilt Repeat these steps 5-10 times: Lie on your back on a firm bed or the floor with your legs extended. Bend your knees so they are pointing toward the ceiling and your feet are flat on the floor. Tighten your lower abdominal muscles to press your lower back against the floor. This motion will tilt your pelvis so your tailbone points up toward the ceiling instead of pointing to your feet or the floor. With gentle tension and even breathing, hold this position for 5-10 seconds. Cat-cow Repeat these steps until your lower back becomes more flexible: Get into a hands-and-knees position on a firm bed or the floor. Keep your hands under your shoulders, and keep your knees under your hips. You may place padding under your knees for comfort. Let your head hang down toward your chest. Contract your abdominal muscles and point your tailbone toward the floor so your lower back becomes rounded like the back of a cat. Hold this position for 5 seconds. Slowly lift  your head, let your abdominal muscles relax, and point your tailbone up toward the ceiling so your back forms a sagging arch like the back of a cow. Hold this position for 5 seconds.  Press-ups Repeat these steps 5-10 times: Lie on your abdomen (face-down) on a firm bed or the floor. Place your palms near your head, about shoulder-width apart. Keeping your back as relaxed as possible and keeping your hips on the floor, slowly straighten your arms to raise the top half of your body and lift your shoulders. Do not use your back muscles to raise your upper torso. You may adjust the placement of your hands to make yourself more comfortable. Hold this position for 5 seconds while you keep your back relaxed. Slowly return to lying flat on the floor.  Bridges Repeat these steps 10 times: Lie on your back on a firm bed or the floor. Bend your knees so they are pointing toward the ceiling and your feet are flat on the  floor. Your arms should be flat at your sides, next to your body. Tighten your buttocks muscles and lift your buttocks off the floor until your waist is at almost the same height as your knees. You should feel the muscles working in your buttocks and the back of your thighs. If you do not feel these muscles, slide your feet 1-2 inches (2.5-5 cm) farther away from your buttocks. Hold this position for 3-5 seconds. Slowly lower your hips to the starting position, and allow your buttocks muscles to relax completely. If this exercise is too easy, try doing it with your arms crossed over your chest. Abdominal crunches Repeat these steps 5-10 times: Lie on your back on a firm bed or the floor with your legs extended. Bend your knees so they are pointing toward the ceiling and your feet are flat on the floor. Cross your arms over your chest. Tip your chin slightly toward your chest without bending your neck. Tighten your abdominal muscles and slowly raise your torso high enough to lift your shoulder blades a tiny bit off the floor. Avoid raising your torso higher than that because it can put too much stress on your lower back and does not help to strengthen your abdominal muscles. Slowly return to your starting position. Back lifts Repeat these steps 5-10 times: Lie on your abdomen (face-down) with your arms at your sides, and rest your forehead on the floor. Tighten the muscles in your legs and your buttocks. Slowly lift your chest off the floor while you keep your hips pressed to the floor. Keep the back of your head in line with the curve in your back. Your eyes should be looking at the floor. Hold this position for 3-5 seconds. Slowly return to your starting position. Contact a health care provider if: Your back pain or discomfort gets much worse when you do an exercise. Your worsening back pain or discomfort does not lessen within 2 hours after you exercise. If you have any of these problems,  stop doing these exercises right away. Do not do them again unless your health care provider says that you can. Get help right away if: You develop sudden, severe back pain. If this happens, stop doing the exercises right away. Do not do them again unless your health care provider says that you can. This information is not intended to replace advice given to you by your health care provider. Make sure you discuss any questions you have with your health care  provider. Document Revised: 08/04/2020 Document Reviewed: 08/04/2020 Elsevier Patient Education  2022 [employer].

## 2021-02-03 LAB — COMPREHENSIVE METABOLIC PANEL
ALT: 62 IU/L — ABNORMAL HIGH (ref 0–32)
AST: 28 IU/L (ref 0–40)
Albumin/Globulin Ratio: 1.9 (ref 1.2–2.2)
Albumin: 4.6 g/dL (ref 3.8–4.8)
Alkaline Phosphatase: 68 IU/L (ref 44–121)
BUN/Creatinine Ratio: 17 (ref 9–23)
BUN: 14 mg/dL (ref 6–20)
Bilirubin Total: <0.2 mg/dL (ref 0.0–1.2)
CO2: 21 mmol/L (ref 20–29)
Calcium: 9.4 mg/dL (ref 8.7–10.2)
Chloride: 106 mmol/L (ref 96–106)
Creatinine, Ser: 0.82 mg/dL (ref 0.57–1.00)
Globulin, Total: 2.4 g/dL (ref 1.5–4.5)
Glucose: 87 mg/dL (ref 65–99)
Potassium: 4.8 mmol/L (ref 3.5–5.2)
Sodium: 143 mmol/L (ref 134–144)
Total Protein: 7 g/dL (ref 6.0–8.5)
eGFR: 94 mL/min/{1.73_m2} (ref >59)

## 2021-02-03 LAB — LIPID PANEL
Chol/HDL Ratio: 4.6 ratio — ABNORMAL HIGH (ref 0.0–4.4)
Cholesterol, Total: 198 mg/dL (ref 100–199)
HDL: 43 mg/dL (ref >39)
LDL Chol Calc (NIH): 112 mg/dL — ABNORMAL HIGH (ref 0–99)
Triglycerides: 247 mg/dL — ABNORMAL HIGH (ref 0–149)
VLDL Cholesterol Cal: 43 mg/dL — ABNORMAL HIGH (ref 5–40)

## 2021-02-03 LAB — CBC WITH DIFFERENTIAL/PLATELET
Basophils Absolute: 0.1 10*3/uL (ref 0.0–0.2)
Basos: 1 %
EOS (ABSOLUTE): 0.1 10*3/uL (ref 0.0–0.4)
Eos: 1 %
Hematocrit: 42.7 % (ref 34.0–46.6)
Hemoglobin: 14.4 g/dL (ref 11.1–15.9)
Immature Grans (Abs): 0 10*3/uL (ref 0.0–0.1)
Immature Granulocytes: 0 %
Lymphocytes Absolute: 2.1 10*3/uL (ref 0.7–3.1)
Lymphs: 30 %
MCH: 29.3 pg (ref 26.6–33.0)
MCHC: 33.7 g/dL (ref 31.5–35.7)
MCV: 87 fL (ref 79–97)
Monocytes Absolute: 0.5 10*3/uL (ref 0.1–0.9)
Monocytes: 8 %
Neutrophils Absolute: 4.1 10*3/uL (ref 1.4–7.0)
Neutrophils: 60 %
Platelets: 294 10*3/uL (ref 150–450)
RBC: 4.91 x10E6/uL (ref 3.77–5.28)
RDW: 13.4 % (ref 11.7–15.4)
WBC: 6.9 10*3/uL (ref 3.4–10.8)

## 2021-02-03 LAB — CARDIOVASCULAR RISK ASSESSMENT

## 2021-02-08 ENCOUNTER — Encounter: Payer: Self-pay | Admitting: Dermatology

## 2021-02-08 ENCOUNTER — Ambulatory Visit: Payer: Commercial Managed Care - PPO | Admitting: Dermatology

## 2021-02-08 ENCOUNTER — Other Ambulatory Visit: Payer: Self-pay

## 2021-02-08 DIAGNOSIS — L309 Dermatitis, unspecified: Secondary | ICD-10-CM

## 2021-02-08 DIAGNOSIS — Z5181 Encounter for therapeutic drug level monitoring: Secondary | ICD-10-CM | POA: Diagnosis not present

## 2021-02-08 DIAGNOSIS — L732 Hidradenitis suppurativa: Secondary | ICD-10-CM | POA: Diagnosis not present

## 2021-02-08 MED ORDER — CLOBETASOL PROPIONATE 0.05 % EX OINT: TOPICAL_OINTMENT | CUTANEOUS | 0 refills | Status: DC

## 2021-02-08 NOTE — Patient Instructions (Addendum)
Psoriasis.org website for information on Humira (patient told dosing would be more like that for inflammatory bowel disease).

## 2021-02-13 ENCOUNTER — Encounter: Payer: Self-pay | Admitting: Dermatology

## 2021-02-13 NOTE — Progress Notes (Signed)
   Follow-Up Visit   Subjective  Jaimey Franchini is a 40 y.o. female who presents for the following: Follow-up (Hidradenitis suppurativa- some better- no inflamed area today tx- rifampicin & clindamycin ).  Hidradenitis suppurativa Location:  Duration:  Quality:  Associated Signs/Symptoms: Modifying Factors:  Severity:  Timing: Context:   Objective  Well appearing patient in no apparent distress; mood and affect are within normal limits. Left Axilla, Right Axilla Follow up on HS no real changes or current flare under arms, groin, under breast.  I again detailed for her the relatively limited number of options for this condition which can have such a negative impact on quality of life.  She expressed a strong interest in trying Humira.  We will obtain screening labs and she will do further reading on this treatment.  Left 3rd Metatarsal Plantar Area Negative fungal test    A focused examination was performed including head and neck.  No active draining areas so today's visit was either eye consultation rather than skin examination.. Relevant physical exam findings are noted in the Assessment and Plan.   Assessment & Plan    Hidradenitis suppurativa Left Axilla; Right Axilla  Stelara or humira discussed at visit and side effects went over with the patient. Ordered test for new start and made patient aware labs must be complete before any further approval process   Related Procedures Hepatitis B surface antibody,quantitative Hepatitis B surface antigen Hepatitis B core antibody, total Hepatitis C antibody QuantiFERON-TB Gold Plus ANA  Related Medications rifampin (RIFADIN) 300 MG capsule Take 1 capsule (300 mg total) by mouth 2 (two) times daily.  clindamycin (CLEOCIN) 300 MG capsule Take 1 capsule (300 mg total) by mouth 2 (two) times daily.  Encounter for therapeutic drug monitoring  Related Procedures Hepatitis B surface antibody,quantitative Hepatitis B  surface antigen Hepatitis B core antibody, total Hepatitis C antibody QuantiFERON-TB Gold Plus ANA  Dermatitis Left 3rd Metatarsal Plantar Area  Apply to foot nightly   clobetasol ointment (TEMOVATE) 0.05 % - Left 3rd Metatarsal Plantar Area Apply to foot nightly      I, Janalyn Harder, MD, have reviewed all documentation for this visit.  The documentation on 02/13/21 for the exam, diagnosis, procedures, and orders are all accurate and complete.

## 2021-03-02 ENCOUNTER — Other Ambulatory Visit: Payer: Self-pay | Admitting: Nurse Practitioner

## 2021-03-02 DIAGNOSIS — F900 Attention-deficit hyperactivity disorder, predominantly inattentive type: Secondary | ICD-10-CM

## 2021-03-03 NOTE — Telephone Encounter (Signed)
Your patient. KC °

## 2021-03-04 MED ORDER — AMPHETAMINE-DEXTROAMPHETAMINE 20 MG PO TABS: 20.0000 mg | ORAL_TABLET | Freq: Every day | ORAL | 0 refills | Status: DC

## 2021-04-08 ENCOUNTER — Other Ambulatory Visit: Payer: Self-pay | Admitting: Nurse Practitioner

## 2021-04-08 DIAGNOSIS — F900 Attention-deficit hyperactivity disorder, predominantly inattentive type: Secondary | ICD-10-CM

## 2021-04-08 MED ORDER — AMPHETAMINE-DEXTROAMPHETAMINE 20 MG PO TABS: 20.0000 mg | ORAL_TABLET | Freq: Every day | ORAL | 0 refills | Status: DC

## 2021-05-04 NOTE — Progress Notes (Signed)
Subjective:  Patient ID: Lauren Faulkner, female    DOB: 1980/09/27  Age: 40 y.o. MRN: [id number]  Chief Complaint  Patient presents with   ADHD   Depression    HPI Naseem is a 40 year old Caucasian female that presents for follow-up of hyperlipidemia and adult ADHD. States she wants to discuss weight loss medications. She tells me that she wants to decrease weight to improve overall quality of life. Current weight 252 lbs, BMI 39.47. states both her parents have type 2 diabetes mellitus, father has CAD. States obesity has caused fatigue and bilateral lower extremity joint pain. States she struggles keeping up with her 65-year-old daughter due to physical deconditioning.   Lipid/Cholesterol, Follow-up  Last lipid panel Other pertinent labs  Lab Results  Component Value Date   CHOL 198 02/02/2021   HDL 43 02/02/2021   LDLCALC 112 (H) 02/02/2021   TRIG 247 (H) 02/02/2021   CHOLHDL 4.6 (H) 02/02/2021   Lab Results  Component Value Date   ALT 62 (H) 02/02/2021   AST 28 02/02/2021   PLT 294 02/02/2021   TSH 1.110 07/13/2020     She was last seen for this 3 months ago.  Management since that visit includes diet controlled, declined statin.  She reports excellent compliance with treatment. She is not having side effects.   Symptoms: No chest pain No chest pressure/discomfort  No dyspnea No lower extremity edema  No numbness or tingling of extremity No orthopnea  No palpitations No paroxysmal nocturnal dyspnea  No speech difficulty No syncope   Current diet: in general, a "healthy" diet   Current exercise: walking   ADHD, inattentive type:  Shekira has a past history of ADHD for several years. Current treatment includes Adderall 20 mg daily. States symptoms are currently well-controlled. Denies side effects of medication.   Current Outpatient Medications on File Prior to Visit  Medication Sig Dispense Refill   amphetamine-dextroamphetamine (ADDERALL) 20 MG tablet Take 1  tablet (20 mg total) by mouth daily. 30 tablet 0   cholecalciferol (VITAMIN D) 1000 units tablet Take 1,000 Units by mouth daily.     lidocaine (LIDODERM) 5 % Place 1 patch onto the skin daily. Remove & Discard patch within 12 hours or as directed by MD 30 patch 2   medroxyPROGESTERone Acetate 150 MG/ML SUSY inject 1 milliliter (150 mg) by intramuscular route every 3 months 2 mL 1   Multiple Vitamin (MULTIVITAMIN) tablet Take 1 tablet by mouth daily.     ondansetron (ZOFRAN-ODT) 8 MG disintegrating tablet DISSOLVE ONE TABLET BY MOUTH EVERY 6 HOURS AS NEEDED 30 tablet 1   rifampin (RIFADIN) 300 MG capsule Take 1 capsule (300 mg total) by mouth 2 (two) times daily. 60 capsule 1   rizatriptan (MAXALT) 10 MG tablet Take 1 tablet (10 mg total) by mouth as needed for migraine. May repeat in 2 hours if needed 10 tablet 2   No current facility-administered medications on file prior to visit.   Past Medical History:  Diagnosis Date   Adenomyosis    fibroadenomoa R breast   Hypothyroid    Insomnia    Interstitial cystitis    Migraines    Vitamin D deficiency    Past Surgical History:  Procedure Laterality Date   CERVICAL CONE BIOPSY      Family History  Problem Relation Age of Onset   Diabetes Mother    Hyperlipidemia Mother    Fibroids Mother    Diabetes Father    Hypertension Father  Hyperlipidemia Father    Ovarian cancer Maternal Grandmother    Stroke Maternal Grandmother    Social History   Socioeconomic History   Marital status: Married    Spouse name: Not on file   Number of children: Not on file   Years of education: Not on file   Highest education level: Not on file  Occupational History   Not on file  Tobacco Use   Smoking status: Former    Types: Cigarettes    Quit date: 06/05/2002    Years since quitting: 18.9   Smokeless tobacco: Never  Substance and Sexual Activity   Alcohol use: Yes    Comment: occ   Drug use: No   Sexual activity: Not on file  Other  Topics Concern   Not on file  Social History Narrative   Pt is nurse, employed at [employer].  Married.  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   Caffeine  1 cup per day.     Social Determinants of Health   Financial Resource Strain: Not on file  Food Insecurity: Not on file  Transportation Needs: Not on file  Physical Activity: Not on file  Stress: Not on file  Social Connections: Not on file    Review of Systems  Constitutional:  Negative for appetite change, fatigue and fever.  HENT:  Negative for congestion, ear pain, sinus pressure and sore throat.   Eyes:  Negative for pain.  Respiratory:  Negative for cough, chest tightness, shortness of breath and wheezing.   Cardiovascular:  Negative for chest pain and palpitations.  Gastrointestinal:  Negative for abdominal pain, constipation, diarrhea, nausea and vomiting.  Genitourinary:  Negative for dysuria and hematuria.  Musculoskeletal:  Negative for arthralgias, back pain, joint swelling and myalgias.  Skin:  Negative for rash.  Neurological:  Negative for dizziness, weakness and headaches.  Psychiatric/Behavioral:  Negative for dysphoric mood. The patient is not nervous/anxious.     Objective:  BP 128/82 (BP Location: Right Arm, Patient Position: Sitting)   Pulse 84   Temp 98.4 F (36.9 C) (Temporal)   Ht 5\' 7"  (1.702 m)   Wt 252 lb (114.3 kg)   SpO2 98%   BMI 39.47 kg/m   BP/Weight 05/05/2021 02/02/2021 0000000  Systolic BP 0000000 0000000 [id number]  Diastolic BP 82 88 72  Wt. (Lbs) 252 233 232  BMI 39.47 36.49 36.34    Physical Exam Vitals reviewed.  Constitutional:      Appearance: She is obese.  Neck:     Vascular: No carotid bruit.  Cardiovascular:     Rate and Rhythm: Normal rate and regular rhythm.     Pulses: Normal pulses.     Heart sounds: Normal heart sounds.  Pulmonary:     Effort: Pulmonary effort is normal. No respiratory distress.     Breath sounds: Normal breath sounds.  Abdominal:     General: Abdomen is flat. Bowel  sounds are normal.     Palpations: Abdomen is soft.     Tenderness: There is no abdominal tenderness.  Neurological:     Mental Status: She is alert and oriented to person, place, and time.  Psychiatric:        Mood and Affect: Mood normal.        Behavior: Behavior normal.     Lab Results  Component Value Date   WBC 6.9 02/02/2021   HGB 14.4 02/02/2021   HCT 42.7 02/02/2021   PLT 294 02/02/2021   GLUCOSE 87 02/02/2021   CHOL  198 02/02/2021   TRIG 247 (H) 02/02/2021   HDL 43 02/02/2021   LDLCALC 112 (H) 02/02/2021   ALT 62 (H) 02/02/2021   AST 28 02/02/2021   NA 143 02/02/2021   K 4.8 02/02/2021   CL 106 02/02/2021   CREATININE 0.82 02/02/2021   BUN 14 02/02/2021   CO2 21 02/02/2021   TSH 1.110 07/13/2020      Assessment & Plan:    1. Mixed hyperlipidemia-not at goal - Semaglutide-Weight Management 0.5 MG/0.5ML SOAJ; Inject 0.5 mg into the skin once a week for 28 days.  Dispense: 2 mL; Refill: 0 - Semaglutide-Weight Management 1 MG/0.5ML SOAJ; Inject 1 mg into the skin once a week for 28 days.  Dispense: 2 mL; Refill: 0 - Semaglutide-Weight Management 1.7 MG/0.75ML SOAJ; Inject 1.7 mg into the skin once a week for 28 days.  Dispense: 3 mL; Refill: 0 - Semaglutide-Weight Management 2.4 MG/0.75ML SOAJ; Inject 2.4 mg into the skin once a week for 28 days.  Dispense: 3 mL; Refill: 0 -pt not fasting today, will return for fasting labs  2. Attention deficit hyperactivity disorder (ADHD), predominantly inattentive type-well controlled -continue Adderall 20 mg daily  3. BMI 39.0-39.9,adult - Semaglutide-Weight Management 0.5 MG/0.5ML SOAJ; Inject 0.5 mg into the skin once a week for 28 days.  Dispense: 2 mL; Refill: 0 - Semaglutide-Weight Management 1 MG/0.5ML SOAJ; Inject 1 mg into the skin once a week for 28 days.  Dispense: 2 mL; Refill: 0 - Semaglutide-Weight Management 1.7 MG/0.75ML SOAJ; Inject 1.7 mg into the skin once a week for 28 days.  Dispense: 3 mL; Refill: 0 -  Semaglutide-Weight Management 2.4 MG/0.75ML SOAJ; Inject 2.4 mg into the skin once a week for 28 days.  Dispense: 3 mL; Refill: 0  4. Class 2 severe obesity due to excess calories with serious comorbidity and body mass index (BMI) of 39.0 to 39.9 in adult [facility]) - Semaglutide-Weight Management 0.5 MG/0.5ML SOAJ; Inject 0.5 mg into the skin once a week for 28 days.  Dispense: 2 mL; Refill: 0 - Semaglutide-Weight Management 1 MG/0.5ML SOAJ; Inject 1 mg into the skin once a week for 28 days.  Dispense: 2 mL; Refill: 0 - Semaglutide-Weight Management 1.7 MG/0.75ML SOAJ; Inject 1.7 mg into the skin once a week for 28 days.  Dispense: 3 mL; Refill: 0 - Semaglutide-Weight Management 2.4 MG/0.75ML SOAJ; Inject 2.4 mg into the skin once a week for 28 days.  Dispense: 3 mL; Refill: 0  5. Encounter for weight management -pt not fasting today, will return for fasting labs  6. Family history of diabetes mellitus in father - Semaglutide-Weight Management 0.5 MG/0.5ML SOAJ; Inject 0.5 mg into the skin once a week for 28 days.  Dispense: 2 mL; Refill: 0 - Semaglutide-Weight Management 1 MG/0.5ML SOAJ; Inject 1 mg into the skin once a week for 28 days.  Dispense: 2 mL; Refill: 0 - Semaglutide-Weight Management 1.7 MG/0.75ML SOAJ; Inject 1.7 mg into the skin once a week for 28 days.  Dispense: 3 mL; Refill: 0 - Semaglutide-Weight Management 2.4 MG/0.75ML SOAJ; Inject 2.4 mg into the skin once a week for 28 days.  Dispense: 3 mL; Refill: 0  7. Family history of diabetes mellitus in mother - Semaglutide-Weight Management 0.5 MG/0.5ML SOAJ; Inject 0.5 mg into the skin once a week for 28 days.  Dispense: 2 mL; Refill: 0 - Semaglutide-Weight Management 1 MG/0.5ML SOAJ; Inject 1 mg into the skin once a week for 28 days.  Dispense: 2 mL; Refill:  0 - Semaglutide-Weight Management 1.7 MG/0.75ML SOAJ; Inject 1.7 mg into the skin once a week for 28 days.  Dispense: 3 mL; Refill: 0 - Semaglutide-Weight Management 2.4  MG/0.75ML SOAJ; Inject 2.4 mg into the skin once a week for 28 days.  Dispense: 3 mL; Refill: 0  8. Family history of early CAD - Semaglutide-Weight Management 0.5 MG/0.5ML SOAJ; Inject 0.5 mg into the skin once a week for 28 days.  Dispense: 2 mL; Refill: 0 - Semaglutide-Weight Management 1 MG/0.5ML SOAJ; Inject 1 mg into the skin once a week for 28 days.  Dispense: 2 mL; Refill: 0 - Semaglutide-Weight Management 1.7 MG/0.75ML SOAJ; Inject 1.7 mg into the skin once a week for 28 days.  Dispense: 3 mL; Refill: 0 - Semaglutide-Weight Management 2.4 MG/0.75ML SOAJ; Inject 2.4 mg into the skin once a week for 28 days.  Dispense: 3 mL; Refill: 0 - Semaglutide-Weight Management 0.25 MG/0.5ML SOAJ; Inject 0.25 mg into the skin once a week for 28 days.  Dispense: 2 mL; Refill: 1   Begin Semaglutide 0.25 mg injection weekly for 4 weeks, then increase to 0.5 mg weekly for 4 weeks, then increase to 1 mg weekly, then increase to 1.7 mg weekly for 4 weeks Follow-up 73-months       Follow-up: 1-months  An After Visit Summary was printed and given to the patient.    I,Lauren M Auman,acting as a Education administrator for [employer], NP.,have documented all relevant documentation on the behalf of Rip Harbour, NP,as directed by  Rip Harbour, NP while in the presence of Rip Harbour, NP.    I, Rip Harbour, NP, have reviewed all documentation for this visit. The documentation on 05/06/21 for the exam, diagnosis, procedures, and orders are all accurate and complete.   Rip Harbour, NP [place] [phone]

## 2021-05-05 ENCOUNTER — Ambulatory Visit: Payer: Commercial Managed Care - PPO | Admitting: Nurse Practitioner

## 2021-05-05 ENCOUNTER — Encounter: Payer: Self-pay | Admitting: Nurse Practitioner

## 2021-05-05 ENCOUNTER — Other Ambulatory Visit: Payer: Self-pay

## 2021-05-05 VITALS — BP 128/82 | HR 84 | Temp 98.4°F | Ht 67.0 in | Wt 252.0 lb

## 2021-05-05 DIAGNOSIS — E782 Mixed hyperlipidemia: Secondary | ICD-10-CM | POA: Diagnosis not present

## 2021-05-05 DIAGNOSIS — Z6839 Body mass index (BMI) 39.0-39.9, adult: Secondary | ICD-10-CM | POA: Diagnosis not present

## 2021-05-05 DIAGNOSIS — Z8249 Family history of ischemic heart disease and other diseases of the circulatory system: Secondary | ICD-10-CM

## 2021-05-05 DIAGNOSIS — F900 Attention-deficit hyperactivity disorder, predominantly inattentive type: Secondary | ICD-10-CM

## 2021-05-05 DIAGNOSIS — Z7689 Persons encountering health services in other specified circumstances: Secondary | ICD-10-CM

## 2021-05-05 DIAGNOSIS — M545 Low back pain, unspecified: Secondary | ICD-10-CM

## 2021-05-05 DIAGNOSIS — Z833 Family history of diabetes mellitus: Secondary | ICD-10-CM

## 2021-05-05 DIAGNOSIS — F330 Major depressive disorder, recurrent, mild: Secondary | ICD-10-CM

## 2021-05-05 MED ORDER — SEMAGLUTIDE-WEIGHT MANAGEMENT 0.25 MG/0.5ML ~~LOC~~ SOAJ: 0.2500 mg | SUBCUTANEOUS | 1 refills | Status: DC

## 2021-05-05 MED ORDER — SEMAGLUTIDE-WEIGHT MANAGEMENT 2.4 MG/0.75ML ~~LOC~~ SOAJ: 2.4000 mg | SUBCUTANEOUS | 0 refills | Status: DC

## 2021-05-05 MED ORDER — SEMAGLUTIDE-WEIGHT MANAGEMENT 0.5 MG/0.5ML ~~LOC~~ SOAJ
0.5000 mg | SUBCUTANEOUS | 0 refills | Status: AC
Start: 2021-06-03 — End: 2021-07-01

## 2021-05-05 MED ORDER — SEMAGLUTIDE-WEIGHT MANAGEMENT 1.7 MG/0.75ML ~~LOC~~ SOAJ: 1.7000 mg | SUBCUTANEOUS | 0 refills | Status: DC

## 2021-05-05 MED ORDER — SEMAGLUTIDE-WEIGHT MANAGEMENT 1 MG/0.5ML ~~LOC~~ SOAJ: 1.0000 mg | SUBCUTANEOUS | 0 refills | Status: DC

## 2021-05-05 MED ORDER — SEMAGLUTIDE(0.25 OR 0.5MG/DOS) 2 MG/1.5ML ~~LOC~~ SOPN: 2.4000 mg | PEN_INJECTOR | SUBCUTANEOUS | 2 refills | Status: DC

## 2021-05-05 NOTE — Patient Instructions (Addendum)
Begin Semaglutide 0.25 mg injection weekly for 4 weeks, then increase to 0.5 mg weekly for 4 weeks, then increase to 1 mg weekly, then increase to 1.7 mg weekly for 4 weeks Follow-up 22-months   Calorie Counting for Weight Loss Calories are units of energy. Your body needs a certain number of calories from food to keep going throughout the day. When you eat or drink more calories than your body needs, your body stores the extra calories mostly as fat. When you eat or drink fewer calories than your body needs, your body burns fat to get the energy it needs. Calorie counting means keeping track of how many calories you eat and drink each day. Calorie counting can be helpful if you need to lose weight. If you eat fewer calories than your body needs, you should lose weight. Ask your health care provider what a healthy weight is for you. For calorie counting to work, you will need to eat the right number of calories each day to lose a healthy amount of weight per week. A dietitian can help you figure out how many calories you need in a day and will suggest ways to reach your calorie goal. A healthy amount of weight to lose each week is usually 1-2 lb (0.5-0.9 kg). This usually means that your daily calorie intake should be reduced by 500-750 calories. Eating 1,200-1,500 calories a day can help most women lose weight. Eating 1,500-1,800 calories a day can help most men lose weight. What do I need to know about calorie counting? Work with your health care provider or dietitian to determine how many calories you should get each day. To meet your daily calorie goal, you will need to: Find out how many calories are in each food that you would like to eat. Try to do this before you eat. Decide how much of the food you plan to eat. Keep a food log. Do this by writing down what you ate and how many calories it had. To successfully lose weight, it is important to balance calorie counting with a healthy lifestyle  that includes regular activity. Where do I find calorie information? The number of calories in a food can be found on a Nutrition Facts label. If a food does not have a Nutrition Facts label, try to look up the calories online or ask your dietitian for help. Remember that calories are listed per serving. If you choose to have more than one serving of a food, you will have to multiply the calories per serving by the number of servings you plan to eat. For example, the label on a package of bread might say that a serving size is 1 slice and that there are 90 calories in a serving. If you eat 1 slice, you will have eaten 90 calories. If you eat 2 slices, you will have eaten 180 calories. How do I keep a food log? After each time that you eat, record the following in your food log as soon as possible: What you ate. Be sure to include toppings, sauces, and other extras on the food. How much you ate. This can be measured in cups, ounces, or number of items. How many calories were in each food and drink. The total number of calories in the food you ate. Keep your food log near you, such as in a pocket-sized notebook or on an app or website on your mobile phone. Some programs will calculate calories for you and show you how many  calories you have left to meet your daily goal. What are some portion-control tips? Know how many calories are in a serving. This will help you know how many servings you can have of a certain food. Use a measuring cup to measure serving sizes. You could also try weighing out portions on a kitchen scale. With time, you will be able to estimate serving sizes for some foods. Take time to put servings of different foods on your favorite plates or in your favorite bowls and cups so you know what a serving looks like. Try not to eat straight from a food's packaging, such as from a bag or box. Eating straight from the package makes it hard to see how much you are eating and can lead to  overeating. Put the amount you would like to eat in a cup or on a plate to make sure you are eating the right portion. Use smaller plates, glasses, and bowls for smaller portions and to prevent overeating. Try not to multitask. For example, avoid watching TV or using your computer while eating. If it is time to eat, sit down at a table and enjoy your food. This will help you recognize when you are full. It will also help you be more mindful of what and how much you are eating. What are tips for following this plan? Reading food labels Check the calorie count compared with the serving size. The serving size may be smaller than what you are used to eating. Check the source of the calories. Try to choose foods that are high in protein, fiber, and vitamins, and low in saturated fat, trans fat, and sodium. Shopping Read nutrition labels while you shop. This will help you make healthy decisions about which foods to buy. Pay attention to nutrition labels for low-fat or fat-free foods. These foods sometimes have the same number of calories or more calories than the full-fat versions. They also often have added sugar, starch, or salt to make up for flavor that was removed with the fat. Make a grocery list of lower-calorie foods and stick to it. Cooking Try to cook your favorite foods in a healthier way. For example, try baking instead of frying. Use low-fat dairy products. Meal planning Use more fruits and vegetables. One-half of your plate should be fruits and vegetables. Include lean proteins, such as chicken, [place], and fish. Lifestyle Each week, aim to do one of the following: 150 minutes of moderate exercise, such as walking. 75 minutes of vigorous exercise, such as running. General information Know how many calories are in the foods you eat most often. This will help you calculate calorie counts faster. Find a way of tracking calories that works for you. Get creative. Try different apps or  programs if writing down calories does not work for you. What foods should I eat?  Eat nutritious foods. It is better to have a nutritious, high-calorie food, such as an avocado, than a food with few nutrients, such as a bag of potato chips. Use your calories on foods and drinks that will fill you up and will not leave you hungry soon after eating. Examples of foods that fill you up are nuts and nut butters, vegetables, lean proteins, and high-fiber foods such as whole grains. High-fiber foods are foods with more than 5 g of fiber per serving. Pay attention to calories in drinks. Low-calorie drinks include water and unsweetened drinks. The items listed above may not be a complete list of foods and beverages  you can eat. Contact a dietitian for more information. What foods should I limit? Limit foods or drinks that are not good sources of vitamins, minerals, or protein or that are high in unhealthy fats. These include: Candy. Other sweets. Sodas, specialty coffee drinks, alcohol, and juice. The items listed above may not be a complete list of foods and beverages you should avoid. Contact a dietitian for more information. How do I count calories when eating out? Pay attention to portions. Often, portions are much larger when eating out. Try these tips to keep portions smaller: Consider sharing a meal instead of getting your own. If you get your own meal, eat only half of it. Before you start eating, ask for a container and put half of your meal into it. When available, consider ordering smaller portions from the menu instead of full portions. Pay attention to your food and drink choices. Knowing the way food is cooked and what is included with the meal can help you eat fewer calories. If calories are listed on the menu, choose the lower-calorie options. Choose dishes that include vegetables, fruits, whole grains, low-fat dairy products, and lean proteins. Choose items that are boiled, broiled,  grilled, or steamed. Avoid items that are buttered, battered, fried, or served with cream sauce. Items labeled as crispy are usually fried, unless stated otherwise. Choose water, low-fat milk, unsweetened iced tea, or other drinks without added sugar. If you want an alcoholic beverage, choose a lower-calorie option, such as a glass of wine or light beer. Ask for dressings, sauces, and syrups on the side. These are usually high in calories, so you should limit the amount you eat. If you want a salad, choose a garden salad and ask for grilled meats. Avoid extra toppings such as bacon, cheese, or fried items. Ask for the dressing on the side, or ask for olive oil and vinegar or lemon to use as dressing. Estimate how many servings of a food you are given. Knowing serving sizes will help you be aware of how much food you are eating at restaurants. Where to find more information Centers for Disease Control and Prevention: FootballExhibition.com.br U.S. Department of Agriculture: WrestlingReporter.dk Summary Calorie counting means keeping track of how many calories you eat and drink each day. If you eat fewer calories than your body needs, you should lose weight. A healthy amount of weight to lose per week is usually 1-2 lb (0.5-0.9 kg). This usually means reducing your daily calorie intake by 500-750 calories. The number of calories in a food can be found on a Nutrition Facts label. If a food does not have a Nutrition Facts label, try to look up the calories online or ask your dietitian for help. Use smaller plates, glasses, and bowls for smaller portions and to prevent overeating. Use your calories on foods and drinks that will fill you up and not leave you hungry shortly after a meal. This information is not intended to replace advice given to you by your health care provider. Make sure you discuss any questions you have with your health care provider. Document Revised: 07/03/2019 Document Reviewed: 07/03/2019 Elsevier Patient  Education  2022 Else[employer] Inc.   Health Maintenance, Female Adopting a healthy lifestyle and getting preventive care are important in promoting health and wellness. Ask your health care provider about: The right schedule for you to have regular tests and exams. Things you can do on your own to prevent diseases and keep yourself healthy. What should I know about diet,  weight, and exercise? Eat a healthy diet  Eat a diet that includes plenty of vegetables, fruits, low-fat dairy products, and lean protein. Do not eat a lot of foods that are high in solid fats, added sugars, or sodium. Maintain a healthy weight Body mass index (BMI) is used to identify weight problems. It estimates body fat based on height and weight. Your health care provider can help determine your BMI and help you achieve or maintain a healthy weight. Get regular exercise Get regular exercise. This is one of the most important things you can do for your health. Most adults should: Exercise for at least 150 minutes each week. The exercise should increase your heart rate and make you sweat (moderate-intensity exercise). Do strengthening exercises at least twice a week. This is in addition to the moderate-intensity exercise. Spend less time sitting. Even light physical activity can be beneficial. Watch cholesterol and blood lipids Have your blood tested for lipids and cholesterol at 38 years of age, then have this test every 5 years. Have your cholesterol levels checked more often if: Your lipid or cholesterol levels are high. You are older than 38 years of age. You are at high risk for heart disease. What should I know about cancer screening? Depending on your health history and family history, you may need to have cancer screening at various ages. This may include screening for: Breast cancer. Cervical cancer. Colorectal cancer. Skin cancer. Lung cancer. What should I know about heart disease, diabetes, and high blood  pressure? Blood pressure and heart disease High blood pressure causes heart disease and increases the risk of stroke. This is more likely to develop in people who have high blood pressure readings or are overweight. Have your blood pressure checked: Every 3-5 years if you are 2-52 years of age. Every year if you are 57 years old or older. Diabetes Have regular diabetes screenings. This checks your fasting blood sugar level. Have the screening done: Once every three years after age 49 if you are at a normal weight and have a low risk for diabetes. More often and at a younger age if you are overweight or have a high risk for diabetes. What should I know about preventing infection? Hepatitis B If you have a higher risk for hepatitis B, you should be screened for this virus. Talk with your health care provider to find out if you are at risk for hepatitis B infection. Hepatitis C Testing is recommended for: Everyone born from [birth year] through 1963. Anyone with known risk factors for hepatitis C. Sexually transmitted infections (STIs) Get screened for STIs, including gonorrhea and chlamydia, if: You are sexually active and are younger than 38 years of age. You are older than 38 years of age and your health care provider tells you that you are at risk for this type of infection. Your sexual activity has changed since you were last screened, and you are at increased risk for chlamydia or gonorrhea. Ask your health care provider if you are at risk. Ask your health care provider about whether you are at high risk for HIV. Your health care provider may recommend a prescription medicine to help prevent HIV infection. If you choose to take medicine to prevent HIV, you should first get tested for HIV. You should then be tested every 3 months for as long as you are taking the medicine. Pregnancy If you are about to stop having your period (premenopausal) and you may become pregnant, seek counseling before you  get pregnant. Take 400 to 800 micrograms (mcg) of folic acid every day if you become pregnant. Ask for birth control (contraception) if you want to prevent pregnancy. Osteoporosis and menopause Osteoporosis is a disease in which the bones lose minerals and strength with aging. This can result in bone fractures. If you are 42 years old or older, or if you are at risk for osteoporosis and fractures, ask your health care provider if you should: Be screened for bone loss. Take a calcium or vitamin D supplement to lower your risk of fractures. Be given hormone replacement therapy (HRT) to treat symptoms of menopause. Follow these instructions at home: Alcohol use Do not drink alcohol if: Your health care provider tells you not to drink. You are pregnant, may be pregnant, or are planning to become pregnant. If you drink alcohol: Limit how much you have to: 0-1 drink a day. Know how much alcohol is in your drink. In the [place] one drink equals one 12 oz bottle of beer (355 mL), one 5 oz glass of wine (148 mL), or one 1 oz glass of hard liquor (44 mL). Lifestyle Do not use any products that contain nicotine or tobacco. These products include cigarettes, chewing tobacco, and vaping devices, such as e-cigarettes. If you need help quitting, ask your health care provider. Do not use street drugs. Do not share needles. Ask your health care provider for help if you need support or information about quitting drugs. General instructions Schedule regular health, dental, and eye exams. Stay current with your vaccines. Tell your health care provider if: You often feel depressed. You have ever been abused or do not feel safe at home. Summary Adopting a healthy lifestyle and getting preventive care are important in promoting health and wellness. Follow your health care provider's instructions about healthy diet, exercising, and getting tested or screened for diseases. Follow your health care provider's  instructions on monitoring your cholesterol and blood pressure. This information is not intended to replace advice given to you by your health care provider. Make sure you discuss any questions you have with your health care provider. Document Revised: 10/11/2020 Document Reviewed: 10/11/2020 Elsevier Patient Education  2022 [employer].

## 2021-05-06 ENCOUNTER — Other Ambulatory Visit: Payer: Self-pay | Admitting: Nurse Practitioner

## 2021-05-06 ENCOUNTER — Telehealth: Payer: Self-pay

## 2021-05-06 DIAGNOSIS — F900 Attention-deficit hyperactivity disorder, predominantly inattentive type: Secondary | ICD-10-CM

## 2021-05-06 DIAGNOSIS — Z8249 Family history of ischemic heart disease and other diseases of the circulatory system: Secondary | ICD-10-CM

## 2021-05-06 MED ORDER — SEMAGLUTIDE-WEIGHT MANAGEMENT 0.25 MG/0.5ML ~~LOC~~ SOAJ: 0.2500 mg | SUBCUTANEOUS | 1 refills | Status: AC

## 2021-05-06 NOTE — Telephone Encounter (Signed)
Patient states that the Ozempic that we sent to the pharmacy they dont have it, patient is now requesting [place] to send it to [facility] stating that they do have a supply of the medication.

## 2021-05-09 ENCOUNTER — Other Ambulatory Visit: Payer: Self-pay | Admitting: Nurse Practitioner

## 2021-05-09 DIAGNOSIS — Z833 Family history of diabetes mellitus: Secondary | ICD-10-CM

## 2021-05-09 DIAGNOSIS — Z6839 Body mass index (BMI) 39.0-39.9, adult: Secondary | ICD-10-CM

## 2021-05-09 DIAGNOSIS — Z8249 Family history of ischemic heart disease and other diseases of the circulatory system: Secondary | ICD-10-CM

## 2021-05-09 DIAGNOSIS — E782 Mixed hyperlipidemia: Secondary | ICD-10-CM

## 2021-05-09 MED ORDER — OZEMPIC (0.25 OR 0.5 MG/DOSE) 2 MG/1.5ML ~~LOC~~ SOPN: 0.5000 mg | PEN_INJECTOR | SUBCUTANEOUS | 0 refills | Status: DC

## 2021-06-13 ENCOUNTER — Other Ambulatory Visit: Payer: Self-pay | Admitting: Nurse Practitioner

## 2021-06-13 DIAGNOSIS — F900 Attention-deficit hyperactivity disorder, predominantly inattentive type: Secondary | ICD-10-CM

## 2021-06-13 MED ORDER — AMPHETAMINE-DEXTROAMPHETAMINE 20 MG PO TABS: 20.0000 mg | ORAL_TABLET | Freq: Every day | ORAL | 0 refills | Status: DC

## 2021-07-07 ENCOUNTER — Telehealth: Payer: Self-pay

## 2021-07-07 NOTE — Telephone Encounter (Signed)
[place] denied due to plan exclusion.   Patient informed and requesting alternative. Please advise.   Royce Macadamia, [place] 07/07/21 1:53 PM

## 2021-07-12 ENCOUNTER — Telehealth: Payer: Self-pay

## 2021-07-12 NOTE — Telephone Encounter (Signed)
PA submitted and denied for Ozempic via covermymeds due to insurance refusing to cover for weight loss.

## 2021-07-12 NOTE — Telephone Encounter (Signed)
Patient called stating her insurance denied coverage for Ozempic this month and requested a alternative option be rx'd.

## 2021-07-13 ENCOUNTER — Telehealth: Payer: Self-pay

## 2021-07-13 NOTE — Telephone Encounter (Signed)
Phone call to patient to see if she's still wanting to do the Humira. Voicemail left for patient to give the office a call back.

## 2021-07-14 NOTE — Telephone Encounter (Signed)
Patient called again requesting update on this.   Lorita Officer, [facility]MA 07/14/21 4:10 PM

## 2021-07-15 ENCOUNTER — Other Ambulatory Visit: Payer: Self-pay | Admitting: Nurse Practitioner

## 2021-07-15 ENCOUNTER — Other Ambulatory Visit: Payer: Self-pay | Admitting: Family Medicine

## 2021-07-15 ENCOUNTER — Telehealth: Payer: Self-pay | Admitting: Nurse Practitioner

## 2021-07-15 DIAGNOSIS — F900 Attention-deficit hyperactivity disorder, predominantly inattentive type: Secondary | ICD-10-CM

## 2021-07-15 MED ORDER — LISDEXAMFETAMINE DIMESYLATE 30 MG PO CAPS: 30.0000 mg | ORAL_CAPSULE | Freq: Every day | ORAL | 0 refills | Status: DC

## 2021-07-15 NOTE — Telephone Encounter (Signed)
Semaglutide denied for weight loss. Pt requested Adipex as alternative. Unable to tolerate Topamax due to side effects of muscle cramps. Pt prescribed Adderall for ADHD. Called pt to change Adderall to Vyvanse to help with ADHD symptoms and binge eating. Pt agreed. Co-pay card placed at front desk for Vyvanse.

## 2021-07-15 NOTE — Telephone Encounter (Signed)
She is requesting adipex be sent to [facility]. She has previously tried topamax and it gave muscle cramps.   Lorita Officer, [place] 07/15/21 8:42 AM

## 2021-07-21 ENCOUNTER — Telehealth: Payer: Self-pay

## 2021-07-21 NOTE — Telephone Encounter (Signed)
Humira new start not completed due to patient not returning our call or getting labs for new start.

## 2021-08-09 NOTE — Progress Notes (Signed)
Subjective:  Patient ID: Lauren Faulkner, female    DOB: 12-12-1980  Age: 41 y.o. MRN: [id number]  Chief Complaint  Patient presents with   Hyperlipidemia   ADHD    HPI  Lauren Faulkner is a 41 year old Caucasian female that presents for follow-up of high cholesterol, depression, and ADHD.    Lipid/Cholesterol, Follow-up  Last lipid panel Other pertinent labs  Lab Results  Component Value Date   CHOL 198 02/02/2021   HDL 43 02/02/2021   LDLCALC 112 (H) 02/02/2021   TRIG 247 (H) 02/02/2021   CHOLHDL 4.6 (H) 02/02/2021   Lab Results  Component Value Date   ALT 62 (H) 02/02/2021   AST 28 02/02/2021   PLT 294 02/02/2021   TSH 1.110 07/13/2020     She was last seen for this 3 months ago.  Management since that visit includes declined to start crestor and vascepa.  Current diet: in general, a "healthy" diet   Current exercise: walking    Depression, Follow-up  She  was last seen for this 3 months ago. Has taken lexapro and xanax in the past.   Current symptoms include: depressed mood and fatigue She feels she is Worse since last visit.  Depression screen [facility] 2/9 08/10/2021 05/05/2021 02/02/2021  Decreased Interest 2 0 0  Down, Depressed, Hopeless 1 0 0  PHQ - 2 Score 3 0 0  Altered sleeping 0 0 0  Tired, decreased energy 3 0 0  Change in appetite 0 0 0  Feeling bad or failure about yourself  1 0 0  Trouble concentrating 2 0 0  Moving slowly or fidgety/restless 2 0 0  Suicidal thoughts 0 0 0  PHQ-9 Score 11 0 0  Difficult doing work/chores Somewhat difficult Not difficult at all Not difficult at all     Anxiety, Follow-up  She was last seen for anxiety 3 months ago. Changes made at last visit include none.  She feels her anxiety is moderate and Worse since last visit.States she has experienced increased stress in her life recently. Has a 47 year old son with autism, high functioning, 70 year old and 39 year-old daughter. Works full-time as a Equities trader.    Symptoms: No chest pain No difficulty concentrating  No dizziness Yes fatigue  No feelings of losing control No insomnia  No irritable No palpitations  No panic attacks No racing thoughts  No shortness of breath No sweating  No tremors/shakes    GAD-7 Results GAD-7 Generalized Anxiety Disorder Screening Tool 08/10/2021  1. Feeling Nervous, Anxious, or on Edge 2  2. Not Being Able to Stop or Control Worrying 3  3. Worrying Too Much About Different Things 3  4. Trouble Relaxing 2  5. Being So Restless it's Hard To Sit Still 1  6. Becoming Easily Annoyed or Irritable 3  7. Feeling Afraid As If Something Awful Might Happen 0  Total GAD-7 Score 14  Difficulty At Work, Home, or Getting  Along With Others? Somewhat difficult   Adult ADHD: Lauren Faulkner has a history of  ADHD,  diagnosed around age 39. Current treatment includes Vyvanse 30 mg daily. States symptoms are not well-controlled currently. Reports she is having difficulty prioritizing and completing tasks, time management, and focusing. She has requested an increase in medication. Denies current side effects.      Current Outpatient Medications on File Prior to Visit  Medication Sig Dispense Refill   cholecalciferol (VITAMIN D) 1000 units tablet Take 1,000 Units by mouth daily.     lidocaine (  LIDODERM) 5 % Place 1 patch onto the skin daily. Remove & Discard patch within 12 hours or as directed by MD 30 patch 2   lisdexamfetamine (VYVANSE) 30 MG capsule Take 1 capsule (30 mg total) by mouth daily. 30 capsule 0   medroxyPROGESTERone Acetate 150 MG/ML SUSY inject 1 milliliter (150 mg) by intramuscular route every 3 months 2 mL 1   Multiple Vitamin (MULTIVITAMIN) tablet Take 1 tablet by mouth daily.     ondansetron (ZOFRAN-ODT) 8 MG disintegrating tablet DISSOLVE ONE TABLET BY MOUTH EVERY 6 HOURS AS NEEDED 30 tablet 1   rizatriptan (MAXALT) 10 MG tablet Take 1 tablet (10 mg total) by mouth as needed for migraine. May repeat in 2 hours if  needed 10 tablet 2   No current facility-administered medications on file prior to visit.   Past Medical History:  Diagnosis Date   Adenomyosis    fibroadenomoa R breast   Hypothyroid    Insomnia    Interstitial cystitis    Migraines    Vitamin D deficiency    Past Surgical History:  Procedure Laterality Date   CERVICAL CONE BIOPSY      Family History  Problem Relation Age of Onset   Diabetes Mother    Hyperlipidemia Mother    Fibroids Mother    Diabetes Father    Hypertension Father    Hyperlipidemia Father    Ovarian cancer Maternal Grandmother    Stroke Maternal Grandmother    Social History   Socioeconomic History   Marital status: Married    Spouse name: Not on file   Number of children: Not on file   Years of education: Not on file   Highest education level: Not on file  Occupational History   Not on file  Tobacco Use   Smoking status: Former    Types: Cigarettes    Quit date: 06/05/2002    Years since quitting: 19.1   Smokeless tobacco: Never  Substance and Sexual Activity   Alcohol use: Yes    Comment: occ   Drug use: No   Sexual activity: Not on file  Other Topics Concern   Not on file  Social History Narrative   Pt is nurse, employed at [employer].  Married.  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   Caffeine  1 cup per day.     Social Determinants of Health   Financial Resource Strain: Not on file  Food Insecurity: Not on file  Transportation Needs: Not on file  Physical Activity: Not on file  Stress: Not on file  Social Connections: Not on file    Review of Systems  Constitutional:  Positive for fatigue. Negative for chills and fever.  HENT:  Negative for congestion, ear pain, rhinorrhea and sore throat.   Eyes: Negative.   Respiratory:  Negative for cough and shortness of breath.   Cardiovascular:  Negative for chest pain.  Gastrointestinal:  Negative for abdominal pain, constipation, diarrhea, nausea and vomiting.  Endocrine: Negative.    Genitourinary:  Negative for dysuria and urgency.  Musculoskeletal:  Negative for back pain and myalgias.  Skin: Negative.   Allergic/Immunologic: Positive for environmental allergies.  Neurological:  Positive for headaches (chronic migraines). Negative for dizziness, weakness and light-headedness.  Hematological: Negative.   Psychiatric/Behavioral:  Positive for decreased concentration, dysphoric mood and sleep disturbance (shift-work sleep disorder). The patient is nervous/anxious.     Objective:  BP 116/82    Pulse 87    Temp (!) 97.3 F (36.3 C)  Ht 5\' 6"  (1.676 m)    Wt 245 lb (111.1 kg)    SpO2 99%    BMI 39.54 kg/m    BP/Weight 08/10/2021 05/05/2021 [id number]  Systolic BP - 0000000 0000000  Diastolic BP - 82 88  Wt. (Lbs) 245 252 233  BMI 39.54 39.47 36.49    Physical Exam Vitals reviewed.  Constitutional:      Appearance: She is obese.  Cardiovascular:     Rate and Rhythm: Normal rate and regular rhythm.     Pulses: Normal pulses.     Heart sounds: Normal heart sounds.  Pulmonary:     Effort: Pulmonary effort is normal.     Breath sounds: Normal breath sounds.  Abdominal:     General: Bowel sounds are normal.     Palpations: Abdomen is soft.  Musculoskeletal:        General: Normal range of motion.  Skin:    General: Skin is warm and dry.     Capillary Refill: Capillary refill takes less than 2 seconds.  Neurological:     General: No focal deficit present.     Mental Status: She is alert and oriented to person, place, and time.  Psychiatric:        Mood and Affect: Mood normal.        Behavior: Behavior normal.        Lab Results  Component Value Date   WBC 6.9 02/02/2021   HGB 14.4 02/02/2021   HCT 42.7 02/02/2021   PLT 294 02/02/2021   GLUCOSE 87 02/02/2021   CHOL 198 02/02/2021   TRIG 247 (H) 02/02/2021   HDL 43 02/02/2021   LDLCALC 112 (H) 02/02/2021   ALT 62 (H) 02/02/2021   AST 28 02/02/2021   NA 143 02/02/2021   K 4.8 02/02/2021   CL 106  02/02/2021   CREATININE 0.82 02/02/2021   BUN 14 02/02/2021   CO2 21 02/02/2021   TSH 1.110 07/13/2020      Assessment & Plan:     1. Mixed hyperlipidemia-not at goal, pt declines statin therapy - Lipid panel -heart healthy diet -increase physical activity  2. Attention deficit hyperactivity disorder (ADHD), predominantly inattentive type-not at goal - lisdexamfetamine (VYVANSE) 40 MG capsule; Take 1 capsule (40 mg total) by mouth every morning.  Dispense: 30 capsule; Refill: 0  3. Moderate recurrent major depression (HCC)-not at goal - buPROPion (WELLBUTRIN) 75 MG tablet; Take 1 tablet (75 mg total) by mouth 2 (two) times daily.  Dispense: 180 tablet; Refill: 0  4. Class 2 severe obesity due to excess calories with serious comorbidity and body mass index (BMI) of 39.0 to 39.9 in adult (HCC) - CBC with Differential/Platelet - Comprehensive metabolic panel - Lipid panel - TSH - VITAMIN D 25 Hydroxy (Vit-D Deficiency, Fractures)  5. GAD (generalized anxiety disorder)-not at goal   6. Non-intractable vomiting with nausea - ondansetron (ZOFRAN-ODT) 8 MG disintegrating tablet; DISSOLVE ONE TABLET BY MOUTH EVERY 6 HOURS AS NEEDED  Dispense: 30 tablet; Refill: 1     Begin Wellbutrin 75 mg daily for one week, then increase to 75 mg twice daily Increase Vyvanse to 40 mg daily We will call you with lab results Follow-up in 40-months, fasting    Follow-up: 63-months  An After Visit Summary was printed and given to the patient.  I, Rip Harbour, NP, have reviewed all documentation for this visit. The documentation on 08/10/21 for the exam, diagnosis, procedures, and orders are all accurate and complete.  Signed, Rip Harbour, NP [place] [phone]

## 2021-08-10 ENCOUNTER — Ambulatory Visit: Payer: Commercial Managed Care - PPO | Admitting: Nurse Practitioner

## 2021-08-10 ENCOUNTER — Other Ambulatory Visit: Payer: Self-pay

## 2021-08-10 ENCOUNTER — Encounter: Payer: Self-pay | Admitting: Nurse Practitioner

## 2021-08-10 VITALS — BP 116/82 | HR 87 | Temp 97.3°F | Ht 66.0 in | Wt 245.0 lb

## 2021-08-10 DIAGNOSIS — F331 Major depressive disorder, recurrent, moderate: Secondary | ICD-10-CM

## 2021-08-10 DIAGNOSIS — E782 Mixed hyperlipidemia: Secondary | ICD-10-CM

## 2021-08-10 DIAGNOSIS — Z6839 Body mass index (BMI) 39.0-39.9, adult: Secondary | ICD-10-CM

## 2021-08-10 DIAGNOSIS — F411 Generalized anxiety disorder: Secondary | ICD-10-CM

## 2021-08-10 DIAGNOSIS — F900 Attention-deficit hyperactivity disorder, predominantly inattentive type: Secondary | ICD-10-CM | POA: Diagnosis not present

## 2021-08-10 DIAGNOSIS — R112 Nausea with vomiting, unspecified: Secondary | ICD-10-CM

## 2021-08-10 MED ORDER — LISDEXAMFETAMINE DIMESYLATE 40 MG PO CAPS: 40.0000 mg | ORAL_CAPSULE | ORAL | 0 refills | Status: DC

## 2021-08-10 MED ORDER — ONDANSETRON 8 MG PO TBDP: ORAL_TABLET | ORAL | 1 refills | Status: DC

## 2021-08-10 MED ORDER — BUPROPION HCL 75 MG PO TABS: 75.0000 mg | ORAL_TABLET | Freq: Two times a day (BID) | ORAL | 0 refills | Status: DC

## 2021-08-10 NOTE — Patient Instructions (Signed)
Begin Wellbutrin 75 mg daily for one week, then increase to 75 mg twice daily ?Increase Vyvanse to 40 mg daily ?We will call you with lab results ?Follow-up in 21-months, fasting ? ? ? ?Managing Depression, Adult ?Depression is a mental health condition that affects your thoughts, feelings, and actions. Being diagnosed with depression can bring you relief if you did not know why you have felt or behaved a certain way. It could also leave you feeling overwhelmed with uncertainty about your future. Preparing yourself to manage your symptoms can help you feel more positive about your future. ?How to manage lifestyle changes ?Managing stress ?Stress is your body's reaction to life changes and events, both good and bad. Stress can add to your feelings of depression. Learning to manage your stress can help lessen your feelings of depression. ?Try some of the following approaches to reducing your stress (stress reduction techniques): ?Listen to music that you enjoy and that inspires you. ?Try using a meditation app or take a meditation class. ?Develop a practice that helps you connect with your spiritual self. Walk in nature, pray, or go to a place of worship. ?Do some deep breathing. To do this, inhale slowly through your nose. Pause at the top of your inhale for a few seconds and then exhale slowly, letting your muscles relax. ?Practice yoga to help relax and work your muscles. ?Choose a stress reduction technique that suits your lifestyle and personality. These techniques take time and practice to develop. Set aside 5-15 minutes a day to do them. Therapists can offer training in these techniques. Other things you can do to manage stress include: ?Keeping a stress diary. ?Knowing your limits and saying no when you think something is too much. ?Paying attention to how you react to certain situations. You may not be able to control everything, but you can change your reaction. ?Adding humor to your life by watching funny  films or TV shows. ?Making time for activities that you enjoy and that relax you. ? ?Medicines ?Medicines, such as antidepressants, are often a part of treatment for depression. ?Talk with your pharmacist or health care provider about all the medicines, supplements, and herbal products that you take, their possible side effects, and what medicines and other products are safe to take together. ?Make sure to report any side effects you may have to your health care provider. ?Relationships ?Your health care provider may suggest family therapy, couples therapy, or individual therapy as part of your treatment. ?How to recognize changes ?Everyone responds differently to treatment for depression. As you recover from depression, you may start to: ?Have more interest in doing activities. ?Feel less hopeless. ?Have more energy. ?Overeat less often, or have a better appetite. ?Have better mental focus. ?It is important to recognize if your depression is not getting better or is getting worse. The symptoms you had in the beginning may return, such as: ?Tiredness (fatigue) or low energy. ?Eating too much or too little. ?Sleeping too much or too little. ?Feeling restless, agitated, or hopeless. ?Trouble focusing or making decisions. ?Unexplained physical complaints. ?Feeling irritable, angry, or aggressive. ?If you or your family members notice these symptoms coming back, let your health care provider know right away. ?Follow these instructions at home: ?Activity ? ?Try to get some form of exercise each day, such as walking, biking, swimming, or lifting weights. ?Practice stress reduction techniques. ?Engage your mind by taking a class or doing some volunteer work. ?Lifestyle ?Get the right amount and quality of sleep. ?  Cut down on using caffeine, tobacco, alcohol, and other potentially harmful substances. ?Eat a healthy diet that includes plenty of vegetables, fruits, whole grains, low-fat dairy products, and lean protein. Do  not eat a lot of foods that are high in solid fats, added sugars, or salt (sodium). ?General instructions ?Take over-the-counter and prescription medicines only as told by your health care provider. ?Keep all follow-up visits as told by your health care provider. This is important. ?Where to find support ?Talking to others ?Friends and family members can be sources of support and guidance. Talk to trusted friends or family members about your condition. Explain your symptoms to them, and let them know that you are working with a health care provider to treat your depression. Tell friends and family members how they also can be helpful. ?Finances ?Find appropriate mental health providers that fit with your financial situation. ?Talk with your health care provider about options to get reduced prices on your medicines. ?Where to find more information ?You can find support in your area from: ?Anxiety and Depression Association of America (ADAA): ProgramCam.de ?Mental Health America: www.mentalhealthamerica.net ?National Alliance on Mental Illness: www.nami.org ?Contact a health care provider if: ?You stop taking your antidepressant medicines, and you have any of these symptoms: ?Nausea. ?Headache. ?Light-headedness. ?Chills and body aches. ?Not being able to sleep (insomnia). ?You or your friends and family think your depression is getting worse. ?Get help right away if: ?You have thoughts of hurting yourself or others. ?If you ever feel like you may hurt yourself or others, or have thoughts about taking your own life, get help right away. Go to your nearest emergency department or: ?Call your local emergency services (911 in the [place] ?Call a suicide crisis helpline, such as the National Suicide Prevention Lifeline at (778)233-6122 or 988 in the [place] This is open 24 hours a day in the [place] ?Text the Crisis Text Line at (403)728-9677 (in the [place] ?Summary ?If you are diagnosed with depression, preparing yourself to manage  your symptoms is a good way to feel positive about your future. ?Work with your health care provider on a management plan that includes stress reduction techniques, medicines (if applicable), therapy, and healthy lifestyle habits. ?Keep talking with your health care provider about how your treatment is working. ?If you have thoughts about taking your own life, call a suicide crisis helpline or text a crisis text line. ?This information is not intended to replace advice given to you by your health care provider. Make sure you discuss any questions you have with your health care provider. ?Document Revised: 12/15/2020 Document Reviewed: 04/02/2019 ?Elsevier Patient Education ? 2022 Elsevier Inc. ? ?

## 2021-08-11 ENCOUNTER — Other Ambulatory Visit: Payer: Self-pay | Admitting: Nurse Practitioner

## 2021-08-11 DIAGNOSIS — F900 Attention-deficit hyperactivity disorder, predominantly inattentive type: Secondary | ICD-10-CM

## 2021-08-11 LAB — COMPREHENSIVE METABOLIC PANEL
ALT: 24 IU/L (ref 0–32)
AST: 16 IU/L (ref 0–40)
Albumin/Globulin Ratio: 2.1 (ref 1.2–2.2)
Albumin: 4.5 g/dL (ref 3.8–4.8)
Alkaline Phosphatase: 67 IU/L (ref 44–121)
BUN/Creatinine Ratio: 20 (ref 9–23)
BUN: 17 mg/dL (ref 6–20)
Bilirubin Total: 0.4 mg/dL (ref 0.0–1.2)
CO2: 23 mmol/L (ref 20–29)
Calcium: 9.5 mg/dL (ref 8.7–10.2)
Chloride: 104 mmol/L (ref 96–106)
Creatinine, Ser: 0.87 mg/dL (ref 0.57–1.00)
Globulin, Total: 2.1 g/dL (ref 1.5–4.5)
Glucose: 103 mg/dL — ABNORMAL HIGH (ref 70–99)
Potassium: 4.5 mmol/L (ref 3.5–5.2)
Sodium: 139 mmol/L (ref 134–144)
Total Protein: 6.6 g/dL (ref 6.0–8.5)
eGFR: 87 mL/min/{1.73_m2} (ref >59)

## 2021-08-11 LAB — LIPID PANEL
Chol/HDL Ratio: 3.8 ratio (ref 0.0–4.4)
Cholesterol, Total: 169 mg/dL (ref 100–199)
HDL: 45 mg/dL (ref >39)
LDL Chol Calc (NIH): 101 mg/dL — ABNORMAL HIGH (ref 0–99)
Triglycerides: 130 mg/dL (ref 0–149)
VLDL Cholesterol Cal: 23 mg/dL (ref 5–40)

## 2021-08-11 LAB — CBC WITH DIFFERENTIAL/PLATELET
Basophils Absolute: 0 10*3/uL (ref 0.0–0.2)
Basos: 1 %
EOS (ABSOLUTE): 0.1 10*3/uL (ref 0.0–0.4)
Eos: 2 %
Hematocrit: 41.6 % (ref 34.0–46.6)
Hemoglobin: 13.8 g/dL (ref 11.1–15.9)
Immature Grans (Abs): 0 10*3/uL (ref 0.0–0.1)
Immature Granulocytes: 0 %
Lymphocytes Absolute: 1.6 10*3/uL (ref 0.7–3.1)
Lymphs: 27 %
MCH: 28.8 pg (ref 26.6–33.0)
MCHC: 33.2 g/dL (ref 31.5–35.7)
MCV: 87 fL (ref 79–97)
Monocytes Absolute: 0.5 10*3/uL (ref 0.1–0.9)
Monocytes: 8 %
Neutrophils Absolute: 3.7 10*3/uL (ref 1.4–7.0)
Neutrophils: 62 %
Platelets: 288 10*3/uL (ref 150–450)
RBC: 4.8 x10E6/uL (ref 3.77–5.28)
RDW: 12.6 % (ref 11.7–15.4)
WBC: 5.9 10*3/uL (ref 3.4–10.8)

## 2021-08-11 LAB — TSH: TSH: 1.44 u[IU]/mL (ref 0.450–4.500)

## 2021-08-11 LAB — VITAMIN D 25 HYDROXY (VIT D DEFICIENCY, FRACTURES): Vit D, 25-Hydroxy: 55.5 ng/mL (ref 30.0–100.0)

## 2021-08-11 LAB — CARDIOVASCULAR RISK ASSESSMENT

## 2021-08-11 MED ORDER — LISDEXAMFETAMINE DIMESYLATE 40 MG PO CAPS
40.0000 mg | ORAL_CAPSULE | ORAL | 0 refills | Status: DC
Start: 2021-08-11 — End: 2021-10-05

## 2021-08-12 LAB — HGB A1C W/O EAG: Hgb A1c MFr Bld: 5.5 % (ref 4.8–5.6)

## 2021-08-12 LAB — SPECIMEN STATUS REPORT

## 2021-08-22 IMAGING — DX DG LUMBAR SPINE COMPLETE 4+V
5 series · 5 of 5 positions shown · non-contrast
Comparison: None.

CLINICAL DATA: Lumbar back pain for 19 years. Worsening pain over
the last 6 months.

EXAM:
LUMBAR SPINE - COMPLETE 4+ VIEW

[l-spine ap]
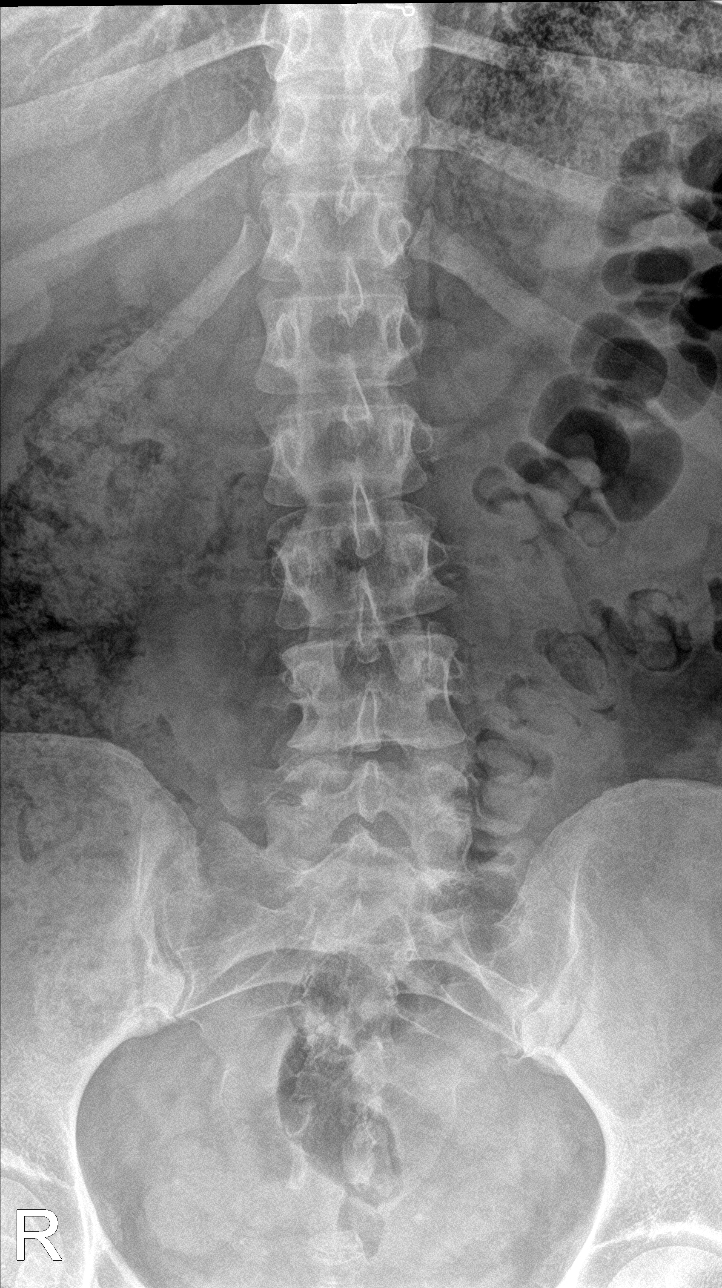

[l-spine obl (1 of 2)]
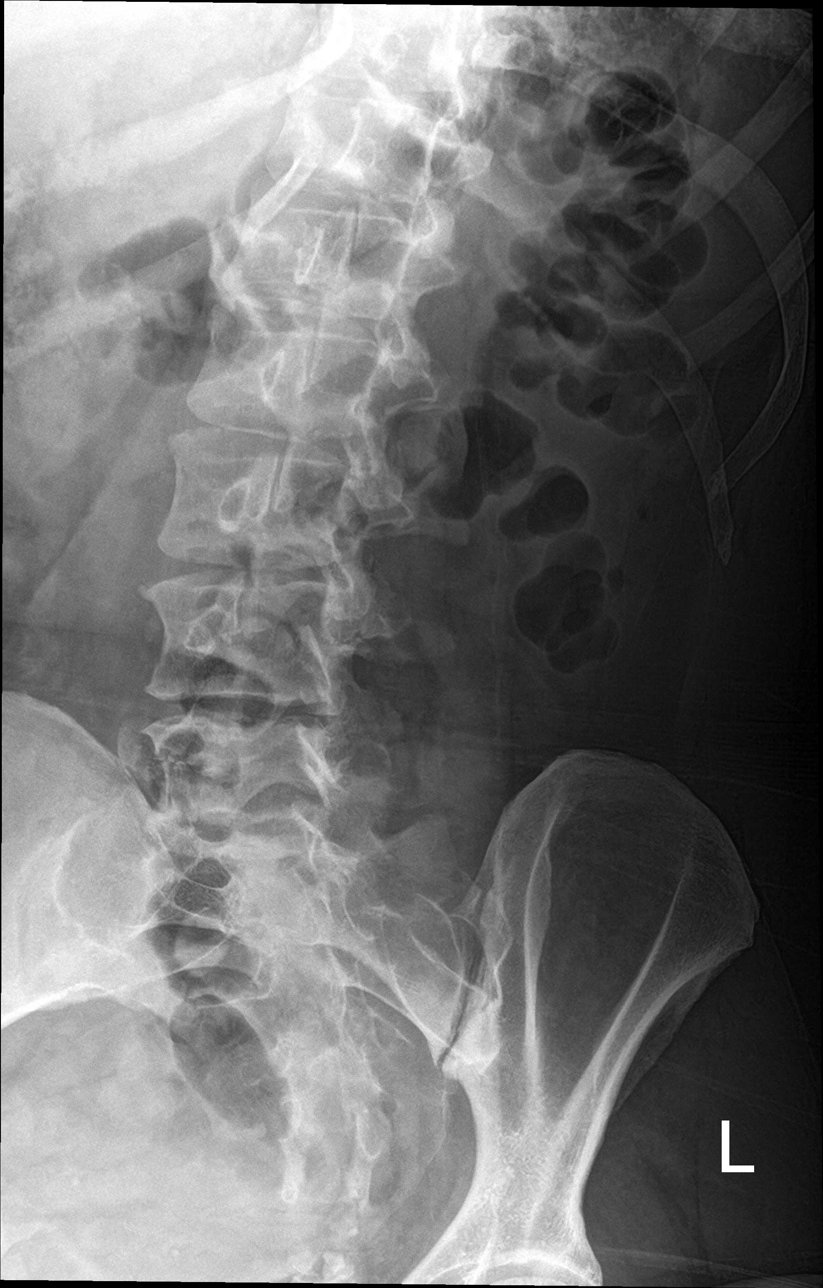

[l-spine obl (2 of 2)]
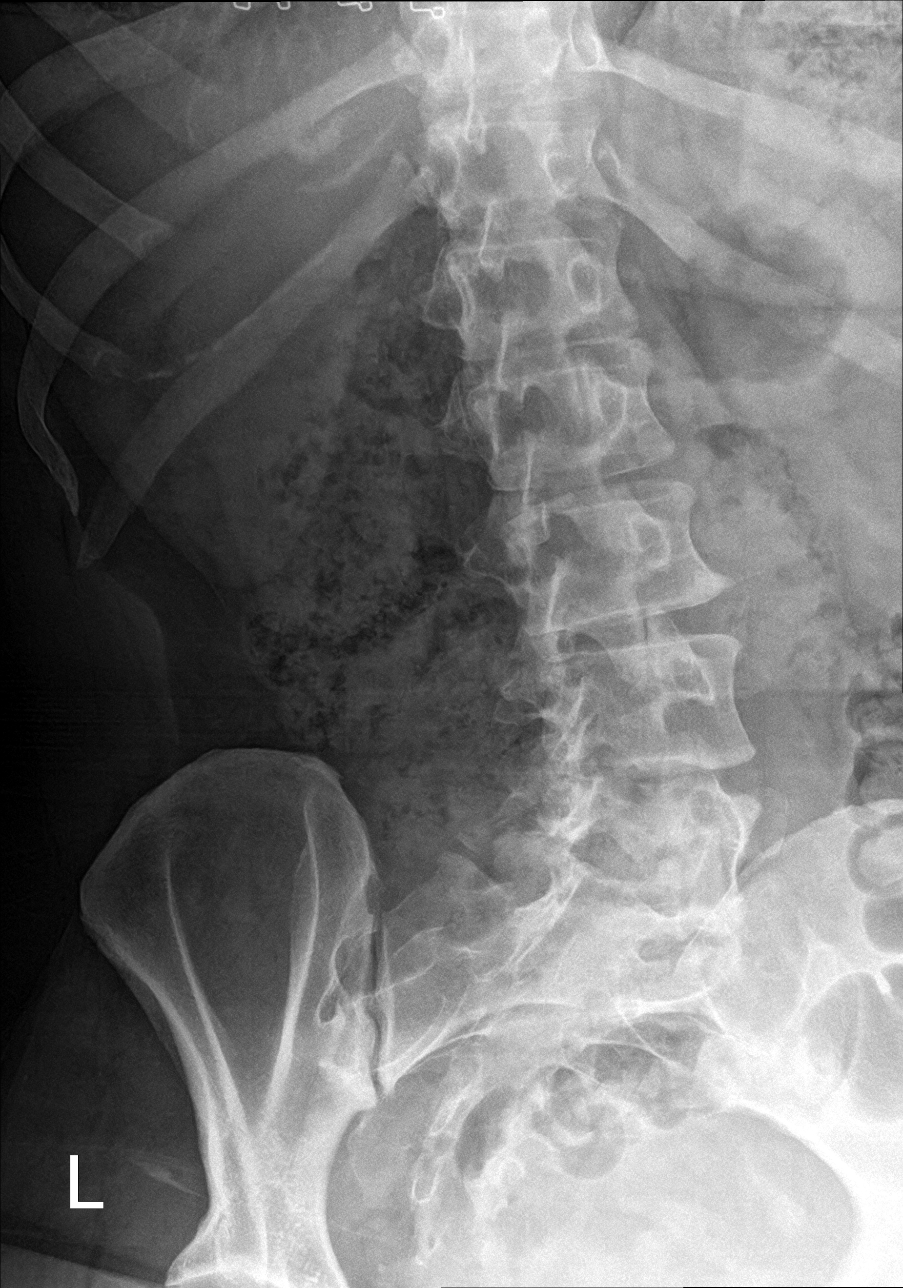

[l-spine lat]
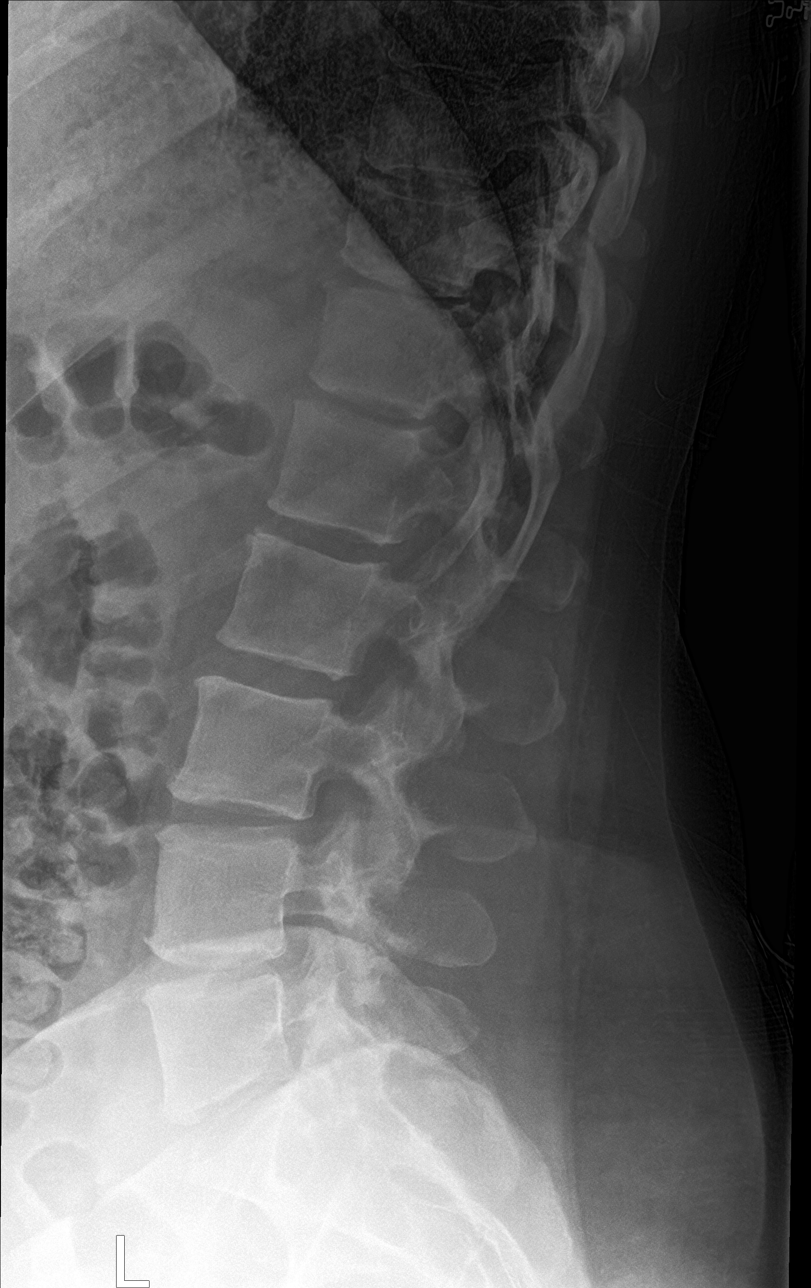

[l-spine spot]
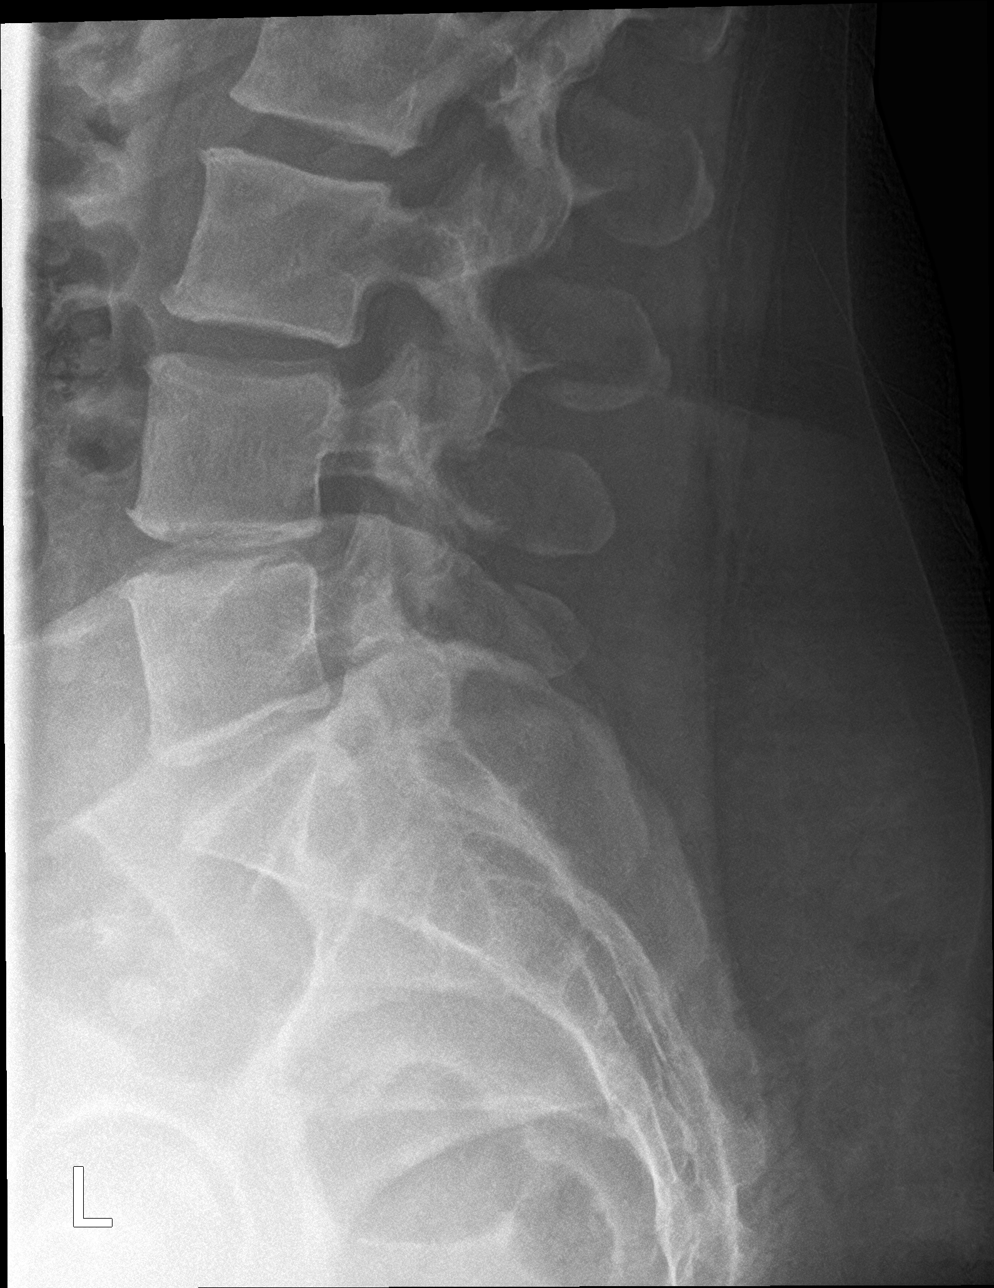

[5 of 5 positions shown; findings below may reference images not displayed]

FINDINGS: There are 5 lumbar type vertebral bodies. The alignment is normal
aside from a mild scoliosis. There is mild disc space narrowing at
L3-4 and L4-5. No evidence of acute fracture or pars defect. Mild
facet degenerative changes are present inferiorly.
IMPRESSION: Prominent lumbar spondylosis for age.  No acute osseous findings.

## 2021-09-05 ENCOUNTER — Other Ambulatory Visit: Payer: Self-pay | Admitting: Nurse Practitioner

## 2021-09-05 DIAGNOSIS — M545 Low back pain, unspecified: Secondary | ICD-10-CM

## 2021-10-05 ENCOUNTER — Other Ambulatory Visit: Payer: Self-pay | Admitting: Nurse Practitioner

## 2021-10-05 DIAGNOSIS — F900 Attention-deficit hyperactivity disorder, predominantly inattentive type: Secondary | ICD-10-CM

## 2021-11-04 ENCOUNTER — Other Ambulatory Visit: Payer: Self-pay | Admitting: Nurse Practitioner

## 2021-11-04 DIAGNOSIS — F900 Attention-deficit hyperactivity disorder, predominantly inattentive type: Secondary | ICD-10-CM

## 2021-11-04 DIAGNOSIS — F331 Major depressive disorder, recurrent, moderate: Secondary | ICD-10-CM

## 2021-11-08 ENCOUNTER — Other Ambulatory Visit: Payer: Self-pay | Admitting: Nurse Practitioner

## 2021-11-08 DIAGNOSIS — F900 Attention-deficit hyperactivity disorder, predominantly inattentive type: Secondary | ICD-10-CM

## 2021-11-09 MED ORDER — BUPROPION HCL 75 MG PO TABS: 75.0000 mg | ORAL_TABLET | Freq: Two times a day (BID) | ORAL | 0 refills | Status: DC

## 2021-11-09 MED ORDER — LISDEXAMFETAMINE DIMESYLATE 40 MG PO CAPS: 40.0000 mg | ORAL_CAPSULE | Freq: Every day | ORAL | 0 refills | Status: DC

## 2021-11-11 ENCOUNTER — Ambulatory Visit: Payer: Commercial Managed Care - PPO | Admitting: Nurse Practitioner

## 2021-11-13 NOTE — Progress Notes (Signed)
Cancelled.  

## 2021-11-14 ENCOUNTER — Ambulatory Visit: Payer: Commercial Managed Care - PPO | Admitting: Nurse Practitioner

## 2021-11-16 ENCOUNTER — Encounter: Payer: Self-pay | Admitting: Nurse Practitioner

## 2021-11-16 ENCOUNTER — Ambulatory Visit: Payer: Commercial Managed Care - PPO | Admitting: Nurse Practitioner

## 2021-11-16 VITALS — BP 114/74 | HR 84 | Resp 18 | Ht 66.0 in | Wt 246.0 lb

## 2021-11-16 DIAGNOSIS — E782 Mixed hyperlipidemia: Secondary | ICD-10-CM | POA: Diagnosis not present

## 2021-11-16 DIAGNOSIS — F331 Major depressive disorder, recurrent, moderate: Secondary | ICD-10-CM | POA: Diagnosis not present

## 2021-11-16 DIAGNOSIS — Z6839 Body mass index (BMI) 39.0-39.9, adult: Secondary | ICD-10-CM | POA: Diagnosis not present

## 2021-11-16 NOTE — Progress Notes (Signed)
Subjective:  Patient ID: Lauren Faulkner, female    DOB: 1980/07/21  Age: 41 y.o. MRN: [id number]  Chief Complaint  Patient presents with   Hyperlipidemia   Depression    HPI Lipid/Cholesterol, Follow-up  Last lipid panel Other pertinent labs  Lab Results  Component Value Date   CHOL 169 08/10/2021   HDL 45 08/10/2021   LDLCALC 101 (H) 08/10/2021   TRIG 130 08/10/2021   CHOLHDL 3.8 08/10/2021   Lab Results  Component Value Date   ALT 24 08/10/2021   AST 16 08/10/2021   PLT 288 08/10/2021   TSH 1.440 08/10/2021     She was last seen for this 3 months ago.  Management includes diet controlled.  She reports excellent compliance with treatment. She is not having side effects.   Current diet: in general, a "healthy" diet   Current exercise: walking  The ASCVD Risk score (Arnett DK, et al., 2019) failed to calculate for the following reasons:   The 2019 ASCVD risk score is only valid for ages 7640 to 8179 Depression, Follow-up  She  was last seen for this 3 months ago. Current treatment includes Wellbutrin 150 mg QD.   She reports excellent compliance with treatment. She is not having side effects.   She reports excellent tolerance of treatment. Current symptoms include: fatigue She feels she is Improved since last visit.      11/16/2021   11:30 AM 08/10/2021    9:57 AM 05/05/2021   11:25 AM  Depression screen PHQ 2/9  Decreased Interest 1 2 0  Down, Depressed, Hopeless 1 1 0  PHQ - 2 Score 2 3 0  Altered sleeping 3 0 0  Tired, decreased energy 2 3 0  Change in appetite 1 0 0  Feeling bad or failure about yourself  1 1 0  Trouble concentrating 3 2 0  Moving slowly or fidgety/restless 0 2 0  Suicidal thoughts 0 0 0  PHQ-9 Score 12 11 0  Difficult doing work/chores Somewhat difficult Somewhat difficult Not difficult at all         08/10/2021    9:57 AM 05/05/2021   11:25 AM 02/02/2021   11:24 AM  Depression screen PHQ 2/9  Decreased Interest 2 0 0  Down,  Depressed, Hopeless 1 0 0  PHQ - 2 Score 3 0 0  Altered sleeping 0 0 0  Tired, decreased energy 3 0 0  Change in appetite 0 0 0  Feeling bad or failure about yourself  1 0 0  Trouble concentrating 2 0 0  Moving slowly or fidgety/restless 2 0 0  Suicidal thoughts 0 0 0  PHQ-9 Score 11 0 0  Difficult doing work/chores Somewhat difficult Not difficult at all Not difficult at all       Current Outpatient Medications on File Prior to Visit  Medication Sig Dispense Refill   buPROPion (WELLBUTRIN) 75 MG tablet Take 1 tablet (75 mg total) by mouth 2 (two) times daily. 180 tablet 0   cholecalciferol (VITAMIN D) 1000 units tablet Take 1,000 Units by mouth daily.     cyclobenzaprine (FLEXERIL) 5 MG tablet Take 1 tablet (5 mg total) by mouth 3 (three) times daily as needed for muscle spasms. 30 tablet 1   lidocaine (LIDODERM) 5 % Place 1 patch onto the skin daily. Remove & Discard patch within 12 hours or as directed by MD 30 patch 2   lisdexamfetamine (VYVANSE) 40 MG capsule Take 1 capsule (40 mg total)  by mouth daily. 30 capsule 0   medroxyPROGESTERone Acetate 150 MG/ML SUSY inject 1 milliliter (150 mg) by intramuscular route every 3 months 2 mL 1   Multiple Vitamin (MULTIVITAMIN) tablet Take 1 tablet by mouth daily.     ondansetron (ZOFRAN-ODT) 8 MG disintegrating tablet DISSOLVE ONE TABLET BY MOUTH EVERY 6 HOURS AS NEEDED 30 tablet 1   rizatriptan (MAXALT) 10 MG tablet Take 1 tablet (10 mg total) by mouth as needed for migraine. May repeat in 2 hours if needed 10 tablet 2   VYVANSE 40 MG capsule Take 1 capsule (40 mg total) by mouth every morning. 30 capsule 0   No current facility-administered medications on file prior to visit.   Past Medical History:  Diagnosis Date   Adenomyosis    fibroadenomoa R breast   Hypothyroid    Insomnia    Interstitial cystitis    Migraines    Vitamin D deficiency    Past Surgical History:  Procedure Laterality Date   CERVICAL CONE BIOPSY      Family  History  Problem Relation Age of Onset   Diabetes Mother    Hyperlipidemia Mother    Fibroids Mother    Diabetes Father    Hypertension Father    Hyperlipidemia Father    Ovarian cancer Maternal Grandmother    Stroke Maternal Grandmother    Social History   Socioeconomic History   Marital status: Married    Spouse name: Not on file   Number of children: Not on file   Years of education: Not on file   Highest education level: Not on file  Occupational History   Not on file  Tobacco Use   Smoking status: Former    Types: Cigarettes    Quit date: 06/05/2002    Years since quitting: 19.4   Smokeless tobacco: Never  Substance and Sexual Activity   Alcohol use: Yes    Comment: occ   Drug use: No   Sexual activity: Not on file  Other Topics Concern   Not on file  Social History Narrative   Pt is nurse, employed at [employer]  Married.  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   Caffeine  1 cup per day.     Social Determinants of Health   Financial Resource Strain: Low Risk  (08/10/2021)   Overall Financial Resource Strain (CARDIA)    Difficulty of Paying Living Expenses: Not hard at all  Food Insecurity: No Food Insecurity (08/10/2021)   Hunger Vital Sign    Worried About Running Out of Food in the Last Year: Never true    Ran Out of Food in the Last Year: Never true  Transportation Needs: No Transportation Needs (08/10/2021)   PRAPARE - Administrator, Civil Service (Medical): No    Lack of Transportation (Non-Medical): No  Physical Activity: Insufficiently Active (08/10/2021)   Exercise Vital Sign    Days of Exercise per Week: 3 days    Minutes of Exercise per Session: 30 min  Stress: No Stress Concern Present (08/10/2021)   [employer] of Occupational Health - Occupational Stress Questionnaire    Feeling of Stress : Not at all  Social Connections: Moderately Isolated (08/10/2021)   Social Connection and Isolation Panel [NHANES]    Frequency of Communication with Friends and  Family: Three times a week    Frequency of Social Gatherings with Friends and Family: Three times a week    Attends Religious Services: Never    Active Member of Clubs  or Organizations: No    Attends Banker Meetings: Never    Marital Status: Married    Review of Systems  Constitutional:  Positive for fatigue.     Objective:  BP 114/74   Pulse 84   Resp 18   Ht 5\' 6"  (1.676 m)   Wt 246 lb (111.6 kg)   SpO2 96%   BMI 39.71 kg/m       08/10/2021    9:55 AM 05/05/2021   11:27 AM 02/02/2021   11:24 AM  BP/Weight  Systolic BP 116 128 128  Diastolic BP 82 82 88  Wt. (Lbs) 245 252 233  BMI 39.54 kg/m2 39.47 kg/m2 36.49 kg/m2    Physical Exam Vitals reviewed.  Constitutional:      Appearance: She is obese.  HENT:     Right Ear: Tympanic membrane normal.     Left Ear: Tympanic membrane normal.     Mouth/Throat:     Mouth: Mucous membranes are moist.  Cardiovascular:     Rate and Rhythm: Normal rate and regular rhythm.  Pulmonary:     Effort: Pulmonary effort is normal.     Breath sounds: Normal breath sounds.  Abdominal:     General: Bowel sounds are normal.     Palpations: Abdomen is soft.  Skin:    General: Skin is warm and dry.     Capillary Refill: Capillary refill takes less than 2 seconds.  Neurological:     General: No focal deficit present.     Mental Status: She is alert and oriented to person, place, and time.  Psychiatric:        Mood and Affect: Mood normal.        Behavior: Behavior normal.       Lab Results  Component Value Date   WBC 5.9 08/10/2021   HGB 13.8 08/10/2021   HCT 41.6 08/10/2021   PLT 288 08/10/2021   GLUCOSE 103 (H) 08/10/2021   CHOL 169 08/10/2021   TRIG 130 08/10/2021   HDL 45 08/10/2021   LDLCALC 101 (H) 08/10/2021   ALT 24 08/10/2021   AST 16 08/10/2021   NA 139 08/10/2021   K 4.5 08/10/2021   CL 104 08/10/2021   CREATININE 0.87 08/10/2021   BUN 17 08/10/2021   CO2 23 08/10/2021   TSH 1.440 08/10/2021    HGBA1C 5.5 08/10/2021      Assessment & Plan:   1. Mixed hyperlipidemia-stable -continue heart healthy diet -continue daily physical activity  2. Moderate recurrent major depression (HCC)- -continue Wellbutrin 150 mg QD  3. Class 2 severe obesity due to excess calories with serious comorbidity and body mass index (BMI) of 39.0 to 39.9 in adult [facility])    Continue medications Contact dietician/nutritionist  Continue heart healthy diet Continue physical activity Follow-up in 39-months     Follow-up: 20-months, fasting  An After Visit Summary was printed and given to the patient.  I, 2-month, NP, have reviewed all documentation for this visit. The documentation on 11/16/21 for the exam, diagnosis, procedures, and orders are all accurate and complete.   Signed, 11/18/21, NP [facility] [phone]

## 2021-11-16 NOTE — Patient Instructions (Addendum)
Continue medications Contact dietician/nutritionist  Continue heart healthy diet Continue physical activity Follow-up in 163-months  Exercising to Lose Weight Getting regular exercise is important for everyone. It is especially important if you are overweight. Being overweight increases your risk of heart disease, stroke, diabetes, high blood pressure, and several types of cancer. Exercising, and reducing the calories you consume, can help you lose weight and improve fitness and health. Exercise can be moderate or vigorous intensity. To lose weight, most people need to do a certain amount of moderate or vigorous-intensity exercise each week. How can exercise affect me? You lose weight when you exercise enough to burn more calories than you eat. Exercise also reduces body fat and builds muscle. The more muscle you have, the more calories you burn. Exercise also: Improves mood. Reduces stress and tension. Improves your overall fitness, flexibility, and endurance. Increases bone strength. Moderate-intensity exercise  Moderate-intensity exercise is any activity that gets you moving enough to burn at least three times more energy (calories) than if you were sitting. Examples of moderate exercise include: Walking a mile in 15 minutes. Doing light yard work. Biking at an easy pace. Most people should get at least 150 minutes of moderate-intensity exercise a week to maintain their body weight. Vigorous-intensity exercise Vigorous-intensity exercise is any activity that gets you moving enough to burn at least six times more calories than if you were sitting. When you exercise at this intensity, you should be working hard enough that you are not able to carry on a conversation. Examples of vigorous exercise include: Running. Playing a team sport, such as football, basketball, and soccer. Jumping rope. Most people should get at least 75 minutes a week of vigorous exercise to maintain their body  weight. What actions can I take to lose weight? The amount of exercise you need to lose weight depends on: Your age. The type of exercise. Any health conditions you have. Your overall physical ability. Talk to your health care provider about how much exercise you need and what types of activities are safe for you. Nutrition  Make changes to your diet as told by your health care provider or diet and nutrition specialist (dietitian). This may include: Eating fewer calories. Eating more protein. Eating less unhealthy fats. Eating a diet that includes fresh fruits and vegetables, whole grains, low-fat dairy products, and lean protein. Avoiding foods with added fat, salt, and sugar. Drink plenty of water while you exercise to prevent dehydration or heat stroke. Activity Choose an activity that you enjoy and set realistic goals. Your health care provider can help you make an exercise plan that works for you. Exercise at a moderate or vigorous intensity most days of the week. The intensity of exercise may vary from person to person. You can tell how intense a workout is for you by paying attention to your breathing and heartbeat. Most people will notice their breathing and heartbeat get faster with more intense exercise. Do resistance training twice each week, such as: Push-ups. Sit-ups. Lifting weights. Using resistance bands. Getting short amounts of exercise can be just as helpful as long, structured periods of exercise. If you have trouble finding time to exercise, try doing these things as part of your daily routine: Get up, stretch, and walk around every 30 minutes throughout the day. Go for a walk during your lunch break. Park your car farther away from your destination. If you take public transportation, get off one stop early and walk the rest of the way. Make  phone calls while standing up and walking around. Take the stairs instead of elevators or escalators. Wear comfortable  clothes and shoes with good support. Do not exercise so much that you hurt yourself, feel dizzy, or get very short of breath. Where to find more information [place]. Department of Health and Human Services: ThisPath.fi Centers for Disease Control and Prevention: FootballExhibition.com.br Contact a health care provider: Before starting a new exercise program. If you have questions or concerns about your weight. If you have a medical problem that keeps you from exercising. Get help right away if: You have any of the following while exercising: Injury. Dizziness. Difficulty breathing or shortness of breath that does not go away when you stop exercising. Chest pain. Rapid heartbeat. These symptoms may represent a serious problem that is an emergency. Do not wait to see if the symptoms will go away. Get medical help right away. Call your local emergency services (911 in the [place] Do not drive yourself to the hospital. Summary Getting regular exercise is especially important if you are overweight. Being overweight increases your risk of heart disease, stroke, diabetes, high blood pressure, and several types of cancer. Losing weight happens when you burn more calories than you eat. Reducing the amount of calories you eat, and getting regular moderate or vigorous exercise each week, helps you lose weight. This information is not intended to replace advice given to you by your health care provider. Make sure you discuss any questions you have with your health care provider. Document Revised: 07/18/2020 Document Reviewed: 07/18/2020 Elsevier Patient Education  2023 Elsevier Inc.    Calorie Counting for [employer] Calories are units of energy. Your body needs a certain number of calories from food to keep going throughout the day. When you eat or drink more calories than your body needs, your body stores the extra calories mostly as fat. When you eat or drink fewer calories than your body needs, your body burns  fat to get the energy it needs. Calorie counting means keeping track of how many calories you eat and drink each day. Calorie counting can be helpful if you need to lose weight. If you eat fewer calories than your body needs, you should lose weight. Ask your health care provider what a healthy weight is for you. For calorie counting to work, you will need to eat the right number of calories each day to lose a healthy amount of weight per week. A dietitian can help you figure out how many calories you need in a day and will suggest ways to reach your calorie goal. A healthy amount of weight to lose each week is usually 1-2 lb (0.5-0.9 kg). This usually means that your daily calorie intake should be reduced by 500-750 calories. Eating 1,200-1,500 calories a day can help most women lose weight. Eating 1,500-1,800 calories a day can help most men lose weight. What do I need to know about calorie counting? Work with your health care provider or dietitian to determine how many calories you should get each day. To meet your daily calorie goal, you will need to: Find out how many calories are in each food that you would like to eat. Try to do this before you eat. Decide how much of the food you plan to eat. Keep a food log. Do this by writing down what you ate and how many calories it had. To successfully lose weight, it is important to balance calorie counting with a healthy lifestyle that includes regular  activity. Where do I find calorie information?  The number of calories in a food can be found on a Nutrition Facts label. If a food does not have a Nutrition Facts label, try to look up the calories online or ask your dietitian for help. Remember that calories are listed per serving. If you choose to have more than one serving of a food, you will have to multiply the calories per serving by the number of servings you plan to eat. For example, the label on a package of bread might say that a serving size is  1 slice and that there are 90 calories in a serving. If you eat 1 slice, you will have eaten 90 calories. If you eat 2 slices, you will have eaten 180 calories. How do I keep a food log? After each time that you eat, record the following in your food log as soon as possible: What you ate. Be sure to include toppings, sauces, and other extras on the food. How much you ate. This can be measured in cups, ounces, or number of items. How many calories were in each food and drink. The total number of calories in the food you ate. Keep your food log near you, such as in a pocket-sized notebook or on an app or website on your mobile phone. Some programs will calculate calories for you and show you how many calories you have left to meet your daily goal. What are some portion-control tips? Know how many calories are in a serving. This will help you know how many servings you can have of a certain food. Use a measuring cup to measure serving sizes. You could also try weighing out portions on a kitchen scale. With time, you will be able to estimate serving sizes for some foods. Take time to put servings of different foods on your favorite plates or in your favorite bowls and cups so you know what a serving looks like. Try not to eat straight from a food's packaging, such as from a bag or box. Eating straight from the package makes it hard to see how much you are eating and can lead to overeating. Put the amount you would like to eat in a cup or on a plate to make sure you are eating the right portion. Use smaller plates, glasses, and bowls for smaller portions and to prevent overeating. Try not to multitask. For example, avoid watching TV or using your computer while eating. If it is time to eat, sit down at a table and enjoy your food. This will help you recognize when you are full. It will also help you be more mindful of what and how much you are eating. What are tips for following this plan? Reading food  labels Check the calorie count compared with the serving size. The serving size may be smaller than what you are used to eating. Check the source of the calories. Try to choose foods that are high in protein, fiber, and vitamins, and low in saturated fat, trans fat, and sodium. Shopping Read nutrition labels while you shop. This will help you make healthy decisions about which foods to buy. Pay attention to nutrition labels for low-fat or fat-free foods. These foods sometimes have the same number of calories or more calories than the full-fat versions. They also often have added sugar, starch, or salt to make up for flavor that was removed with the fat. Make a grocery list of lower-calorie foods and stick to it.  Cooking Try to cook your favorite foods in a healthier way. For example, try baking instead of frying. Use low-fat dairy products. Meal planning Use more fruits and vegetables. One-half of your plate should be fruits and vegetables. Include lean proteins, such as chicken, [place], and fish. Lifestyle Each week, aim to do one of the following: 150 minutes of moderate exercise, such as walking. 75 minutes of vigorous exercise, such as running. General information Know how many calories are in the foods you eat most often. This will help you calculate calorie counts faster. Find a way of tracking calories that works for you. Get creative. Try different apps or programs if writing down calories does not work for you. What foods should I eat?  Eat nutritious foods. It is better to have a nutritious, high-calorie food, such as an avocado, than a food with few nutrients, such as a bag of potato chips. Use your calories on foods and drinks that will fill you up and will not leave you hungry soon after eating. Examples of foods that fill you up are nuts and nut butters, vegetables, lean proteins, and high-fiber foods such as whole grains. High-fiber foods are foods with more than 5 g of fiber  per serving. Pay attention to calories in drinks. Low-calorie drinks include water and unsweetened drinks. The items listed above may not be a complete list of foods and beverages you can eat. Contact a dietitian for more information. What foods should I limit? Limit foods or drinks that are not good sources of vitamins, minerals, or protein or that are high in unhealthy fats. These include: Candy. Other sweets. Sodas, specialty coffee drinks, alcohol, and juice. The items listed above may not be a complete list of foods and beverages you should avoid. Contact a dietitian for more information. How do I count calories when eating out? Pay attention to portions. Often, portions are much larger when eating out. Try these tips to keep portions smaller: Consider sharing a meal instead of getting your own. If you get your own meal, eat only half of it. Before you start eating, ask for a container and put half of your meal into it. When available, consider ordering smaller portions from the menu instead of full portions. Pay attention to your food and drink choices. Knowing the way food is cooked and what is included with the meal can help you eat fewer calories. If calories are listed on the menu, choose the lower-calorie options. Choose dishes that include vegetables, fruits, whole grains, low-fat dairy products, and lean proteins. Choose items that are boiled, broiled, grilled, or steamed. Avoid items that are buttered, battered, fried, or served with cream sauce. Items labeled as crispy are usually fried, unless stated otherwise. Choose water, low-fat milk, unsweetened iced tea, or other drinks without added sugar. If you want an alcoholic beverage, choose a lower-calorie option, such as a glass of wine or light beer. Ask for dressings, sauces, and syrups on the side. These are usually high in calories, so you should limit the amount you eat. If you want a salad, choose a garden salad and ask for  grilled meats. Avoid extra toppings such as bacon, cheese, or fried items. Ask for the dressing on the side, or ask for olive oil and vinegar or lemon to use as dressing. Estimate how many servings of a food you are given. Knowing serving sizes will help you be aware of how much food you are eating at restaurants. Where to find more  information Centers for Disease Control and Prevention: FootballExhibition.com.br U.[place]. Department of Agriculture: WrestlingReporter.dk Summary Calorie counting means keeping track of how many calories you eat and drink each day. If you eat fewer calories than your body needs, you should lose weight. A healthy amount of weight to lose per week is usually 1-2 lb (0.5-0.9 kg). This usually means reducing your daily calorie intake by 500-750 calories. The number of calories in a food can be found on a Nutrition Facts label. If a food does not have a Nutrition Facts label, try to look up the calories online or ask your dietitian for help. Use smaller plates, glasses, and bowls for smaller portions and to prevent overeating. Use your calories on foods and drinks that will fill you up and not leave you hungry shortly after a meal. This information is not intended to replace advice given to you by your health care provider. Make sure you discuss any questions you have with your health care provider. Document Revised: 07/03/2019 Document Reviewed: 07/03/2019 Elsevier Patient Education  2023 [employer].

## 2021-11-17 ENCOUNTER — Encounter: Payer: Self-pay | Admitting: Nurse Practitioner

## 2021-11-17 ENCOUNTER — Other Ambulatory Visit: Payer: Self-pay | Admitting: Nurse Practitioner

## 2021-11-17 DIAGNOSIS — F330 Major depressive disorder, recurrent, mild: Secondary | ICD-10-CM

## 2021-11-17 MED ORDER — BUPROPION HCL ER (XL) 300 MG PO TB24: 300.0000 mg | ORAL_TABLET | Freq: Every day | ORAL | 1 refills | Status: DC

## 2021-11-23 ENCOUNTER — Other Ambulatory Visit: Payer: Self-pay | Admitting: Nurse Practitioner

## 2021-11-23 DIAGNOSIS — G43709 Chronic migraine without aura, not intractable, without status migrainosus: Secondary | ICD-10-CM

## 2021-11-28 ENCOUNTER — Encounter: Payer: Self-pay | Admitting: Nurse Practitioner

## 2021-11-28 ENCOUNTER — Other Ambulatory Visit: Payer: Self-pay

## 2021-11-28 DIAGNOSIS — G43709 Chronic migraine without aura, not intractable, without status migrainosus: Secondary | ICD-10-CM

## 2021-11-28 DIAGNOSIS — M545 Low back pain, unspecified: Secondary | ICD-10-CM

## 2021-11-28 MED ORDER — CYCLOBENZAPRINE HCL 5 MG PO TABS: 5.0000 mg | ORAL_TABLET | Freq: Three times a day (TID) | ORAL | 1 refills | Status: DC | PRN

## 2021-11-28 MED ORDER — RIZATRIPTAN BENZOATE 10 MG PO TABS: 10.0000 mg | ORAL_TABLET | ORAL | 2 refills | Status: DC | PRN

## 2021-12-02 ENCOUNTER — Other Ambulatory Visit: Payer: Self-pay | Admitting: Nurse Practitioner

## 2021-12-02 DIAGNOSIS — R112 Nausea with vomiting, unspecified: Secondary | ICD-10-CM

## 2021-12-30 ENCOUNTER — Telehealth: Payer: Commercial Managed Care - PPO | Admitting: Nurse Practitioner

## 2022-01-21 ENCOUNTER — Other Ambulatory Visit: Payer: Self-pay | Admitting: Nurse Practitioner

## 2022-01-21 DIAGNOSIS — M545 Low back pain, unspecified: Secondary | ICD-10-CM

## 2022-01-21 DIAGNOSIS — R112 Nausea with vomiting, unspecified: Secondary | ICD-10-CM

## 2022-01-21 DIAGNOSIS — F900 Attention-deficit hyperactivity disorder, predominantly inattentive type: Secondary | ICD-10-CM

## 2022-02-20 ENCOUNTER — Ambulatory Visit: Payer: Commercial Managed Care - PPO | Admitting: Nurse Practitioner

## 2022-02-20 ENCOUNTER — Encounter: Payer: Self-pay | Admitting: Nurse Practitioner

## 2022-02-20 VITALS — BP 122/70 | HR 85 | Temp 97.3°F | Ht 67.0 in | Wt 239.0 lb

## 2022-02-20 DIAGNOSIS — Z20822 Contact with and (suspected) exposure to covid-19: Secondary | ICD-10-CM

## 2022-02-20 DIAGNOSIS — M545 Low back pain, unspecified: Secondary | ICD-10-CM

## 2022-02-20 DIAGNOSIS — E782 Mixed hyperlipidemia: Secondary | ICD-10-CM | POA: Diagnosis not present

## 2022-02-20 DIAGNOSIS — F330 Major depressive disorder, recurrent, mild: Secondary | ICD-10-CM

## 2022-02-20 DIAGNOSIS — G43709 Chronic migraine without aura, not intractable, without status migrainosus: Secondary | ICD-10-CM | POA: Diagnosis not present

## 2022-02-20 DIAGNOSIS — F900 Attention-deficit hyperactivity disorder, predominantly inattentive type: Secondary | ICD-10-CM

## 2022-02-20 DIAGNOSIS — Z6839 Body mass index (BMI) 39.0-39.9, adult: Secondary | ICD-10-CM

## 2022-02-20 MED ORDER — RIZATRIPTAN BENZOATE 10 MG PO TABS: 10.0000 mg | ORAL_TABLET | ORAL | 2 refills | Status: DC | PRN

## 2022-02-20 MED ORDER — BUPROPION HCL ER (XL) 300 MG PO TB24: 300.0000 mg | ORAL_TABLET | Freq: Every day | ORAL | 1 refills | Status: DC

## 2022-02-20 MED ORDER — BUPROPION HCL ER (SR) 150 MG PO TB12: 150.0000 mg | ORAL_TABLET | Freq: Two times a day (BID) | ORAL | 1 refills | Status: DC

## 2022-02-20 MED ORDER — LISDEXAMFETAMINE DIMESYLATE 40 MG PO CAPS: 40.0000 mg | ORAL_CAPSULE | Freq: Every day | ORAL | 0 refills | Status: DC

## 2022-02-20 MED ORDER — BUPROPION HCL ER (XL) 150 MG PO TB24: 150.0000 mg | ORAL_TABLET | Freq: Every day | ORAL | 1 refills | Status: DC

## 2022-02-20 MED ORDER — METHOCARBAMOL 500 MG PO TABS: 500.0000 mg | ORAL_TABLET | Freq: Four times a day (QID) | ORAL | 1 refills | Status: DC | PRN

## 2022-02-20 NOTE — Progress Notes (Signed)
Subjective:  Patient ID: Lauren Faulkner, female    DOB: 1981/05/25  Age: 41 y.o. MRN: [id number]  CC: Hyperlipidemia ADHD Depression   HPI  Pt presents for follow-up of hyperlipidemia, ADHD, and depression. Reports she will obtain flu vaccine at her place of employment. Pt has lost 7 pounds since last visit with diet and exercise. Pt works as an Charity fundraiser at [employer]. She tells me her entire household had COVID-19 a few weeks ago. Reports she has never tested positive for COVID-19. She has requested a COVID-19 antibody test today. Denies current or previous symptoms.    Lipid/Cholesterol, Follow-up  Last lipid panel Other pertinent labs  Lab Results  Component Value Date   CHOL 169 08/10/2021   HDL 45 08/10/2021   LDLCALC 101 (H) 08/10/2021   TRIG 130 08/10/2021   CHOLHDL 3.8 08/10/2021   Lab Results  Component Value Date   ALT 24 08/10/2021   AST 16 08/10/2021   PLT 288 08/10/2021   TSH 1.440 08/10/2021     She was last seen for this 3 months ago.  Management includes diet and exercise. She reports excellent compliance with treatment. She is not having side effects.  Current diet: in general, a "healthy" diet   Current exercise: none  Depression, Follow-up  Current treatment includes wellbutrin.  She reports excellent compliance with treatment. She is not having side effects.  She reports good tolerance of treatment. Current symptoms include: fatigue She feels she is Improved since last visit.     02/20/2022    7:52 AM 11/16/2021   11:30 AM 08/10/2021    9:57 AM  Depression screen PHQ 2/9  Decreased Interest 1 1 2   Down, Depressed, Hopeless 1 1 1   PHQ - 2 Score 2 2 3   Altered sleeping 0 3 0  Tired, decreased energy 3 2 3   Change in appetite 1 1 0  Feeling bad or failure about yourself  1 1 1   Trouble concentrating 1 3 2   Moving slowly or fidgety/restless 1 0 2  Suicidal thoughts 0 0 0  PHQ-9 Score 9 12 11   Difficult doing work/chores Somewhat  difficult Somewhat difficult Somewhat difficult    ADHD, follow-up: Pt has a history of ADHD, inattentive type for several years. Current treatment includes Vyvanse 40 mg QD. States symptoms are currently well-controlled. Denies side effects of treatment. She is adherent to follow-up appointments and medication regimen.   Current Outpatient Medications on File Prior to Visit  Medication Sig Dispense Refill   buPROPion (WELLBUTRIN XL) 300 MG 24 hr tablet Take 1 tablet (300 mg total) by mouth daily. 90 tablet 1   cholecalciferol (VITAMIN D) 1000 units tablet Take 1,000 Units by mouth daily.     cyclobenzaprine (FLEXERIL) 5 MG tablet Take 1 tablet (5 mg total) by mouth 3 (three) times daily as needed for muscle spasms. 30 tablet 1   lidocaine (LIDODERM) 5 % Place 1 patch onto the skin daily. Remove & Discard patch within 12 hours or as directed by MD 30 patch 2   Multiple Vitamin (MULTIVITAMIN) tablet Take 1 tablet by mouth daily.     ondansetron (ZOFRAN-ODT) 8 MG disintegrating tablet DISSOLVE ONE TABLET BY MOUTH EVERY 6 HOURS AS NEEDED 30 tablet 1   rizatriptan (MAXALT) 10 MG tablet Take 1 tablet (10 mg total) by mouth as needed for migraine. May repeat in 2 hours if needed 10 tablet 2   VYVANSE 40 MG capsule Take 1 capsule (40 mg total)  by mouth daily. 30 capsule 0   No current facility-administered medications on file prior to visit.   Past Medical History:  Diagnosis Date   Adenomyosis    fibroadenomoa R breast   Hypothyroid    Insomnia    Interstitial cystitis    Migraines    Vitamin D deficiency    Past Surgical History:  Procedure Laterality Date   CERVICAL CONE BIOPSY      Family History  Problem Relation Age of Onset   Diabetes Mother    Hyperlipidemia Mother    Fibroids Mother    Diabetes Father    Hypertension Father    Hyperlipidemia Father    Ovarian cancer Maternal Grandmother    Stroke Maternal Grandmother    Social History   Socioeconomic History   Marital  status: Married    Spouse name: Not on file   Number of children: Not on file   Years of education: Not on file   Highest education level: Not on file  Occupational History   Not on file  Tobacco Use   Smoking status: Former    Types: Cigarettes    Quit date: 06/05/2002    Years since quitting: 19.7   Smokeless tobacco: Never  Substance and Sexual Activity   Alcohol use: Yes    Comment: occ   Drug use: No   Sexual activity: Not on file  Other Topics Concern   Not on file  Social History Narrative   Pt is nurse, employed at [employer].  Married.  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   Caffeine  1 cup per day.     Social Determinants of Health   Financial Resource Strain: Low Risk  (08/10/2021)   Overall Financial Resource Strain (CARDIA)    Difficulty of Paying Living Expenses: Not hard at all  Food Insecurity: No Food Insecurity (08/10/2021)   Hunger Vital Sign    Worried About Running Out of Food in the Last Year: Never true    Ran Out of Food in the Last Year: Never true  Transportation Needs: No Transportation Needs (08/10/2021)   PRAPARE - Administrator, Civil Service (Medical): No    Lack of Transportation (Non-Medical): No  Physical Activity: Insufficiently Active (08/10/2021)   Exercise Vital Sign    Days of Exercise per Week: 3 days    Minutes of Exercise per Session: 30 min  Stress: No Stress Concern Present (08/10/2021)   [employer] of Occupational Health - Occupational Stress Questionnaire    Feeling of Stress : Not at all  Social Connections: Moderately Isolated (08/10/2021)   Social Connection and Isolation Panel [NHANES]    Frequency of Communication with Friends and Family: Three times a week    Frequency of Social Gatherings with Friends and Family: Three times a week    Attends Religious Services: Never    Active Member of Clubs or Organizations: No    Attends Banker Meetings: Never    Marital Status: Married    Review of Systems   Constitutional:  Negative for chills, fatigue and fever.  HENT:  Negative for congestion, ear pain, rhinorrhea and sore throat.   Respiratory:  Negative for cough and shortness of breath.   Cardiovascular:  Negative for chest pain.  Gastrointestinal:  Negative for abdominal pain, constipation, diarrhea, nausea and vomiting.  Genitourinary:  Negative for dysuria and urgency.  Musculoskeletal:  Positive for back pain (chronic) and myalgias (lower back-chronic).  Neurological:  Negative for dizziness, weakness, light-headedness  and headaches.  Psychiatric/Behavioral:  Negative for dysphoric mood. The patient is not nervous/anxious.      Objective:  BP 122/70   Pulse 85   Temp (!) 97.3 F (36.3 C)   Ht 5\' 7"  (1.702 m)   Wt 239 lb (108.4 kg)   LMP 02/17/2022   SpO2 99%   BMI 37.43 kg/m       11/16/2021   11:27 AM 08/10/2021    9:55 AM 05/05/2021   11:27 AM  BP/Weight  Systolic BP 782 956 213  Diastolic BP 74 82 82  Wt. (Lbs) 246 245 252  BMI 39.71 kg/m2 39.54 kg/m2 39.47 kg/m2    Physical Exam Vitals reviewed.  Constitutional:      Appearance: She is obese.  HENT:     Right Ear: Tympanic membrane normal.     Left Ear: Tympanic membrane normal.     Nose: Nose normal.     Mouth/Throat:     Mouth: Mucous membranes are moist.  Eyes:     Pupils: Pupils are equal, round, and reactive to light.  Cardiovascular:     Rate and Rhythm: Normal rate and regular rhythm.     Pulses: Normal pulses.     Heart sounds: Normal heart sounds.  Pulmonary:     Effort: Pulmonary effort is normal.     Breath sounds: Normal breath sounds.  Skin:    General: Skin is warm and dry.     Capillary Refill: Capillary refill takes less than 2 seconds.  Neurological:     General: No focal deficit present.     Mental Status: She is alert and oriented to person, place, and time.  Psychiatric:        Mood and Affect: Mood normal.        Behavior: Behavior normal.     Lab Results  Component  Value Date   WBC 5.9 08/10/2021   HGB 13.8 08/10/2021   HCT 41.6 08/10/2021   PLT 288 08/10/2021   GLUCOSE 103 (H) 08/10/2021   CHOL 169 08/10/2021   TRIG 130 08/10/2021   HDL 45 08/10/2021   LDLCALC 101 (H) 08/10/2021   ALT 24 08/10/2021   AST 16 08/10/2021   NA 139 08/10/2021   K 4.5 08/10/2021   CL 104 08/10/2021   CREATININE 0.87 08/10/2021   BUN 17 08/10/2021   CO2 23 08/10/2021   TSH 1.440 08/10/2021   HGBA1C 5.5 08/10/2021      Assessment & Plan:   1. Mixed hyperlipidemia-not at goal - CBC with Differential/Platelet - Comprehensive metabolic panel - Lipid panel -continue heart healthy diet -continue regular physical activity  2. Lumbar back pain-stable - methocarbamol (ROBAXIN) 500 MG tablet; Take 1 tablet (500 mg total) by mouth every 6 (six) hours as needed for muscle spasms.  Dispense: 180 tablet; Refill:1 -continue back exercises  3. Chronic migraine without aura without status migrainosus, not intractable-well controlled - rizatriptan (MAXALT) 10 MG tablet; Take 1 tablet (10 mg total) by mouth as needed for migraine. May repeat in 2 hours if needed  Dispense: 10 tablet; Refill: 2  4. Class 2 severe obesity due to excess calories with serious comorbidity and body mass index (BMI) of 39.0 to 39.9 in adult (HCC) - CBC with Differential/Platelet - Comprehensive metabolic panel - Lipid panel  5. Mild recurrent major depression (HCC)-not at goal - buPROPion (WELLBUTRIN SR) 150 MG 12 hr tablet; Take 1 tablet (150 mg total) by mouth 2 (two) times daily.  Dispense:  90 tablet; Refill: 1 - buPROPion (WELLBUTRIN XL) 300 MG 24 hr tablet; Take 1 tablet (300 mg total) by mouth daily.  Dispense: 90 tablet; Refill: 1  6. Attention deficit hyperactivity disorder (ADHD), predominantly inattentive type-well controlled - lisdexamfetamine (VYVANSE) 40 MG capsule; Take 1 capsule (40 mg total) by mouth daily.  Dispense: 30 capsule; Refill: 0  7. Exposure to COVID-19 virus -  SARS-CoV-2 Antibodies     Increase Wellbutrin to 450 mg daily Stop Flexeril Begin Robaxin 500 mg up to 4 times daily for back pain/muscle spasms We will call you with labs  Follow-up in 153-months, fasting  Follow-up: 663-months, fasting  An After Visit Summary was printed and given to the patient.  I, Janie MorningShannon J Eann Cleland, NP, have reviewed all documentation for this visit. The documentation on 02/20/22 for the exam, diagnosis, procedures, and orders are all accurate and complete.   Signed, Janie MorningShannon J Chenita Ruda, NP [facility] [phone]

## 2022-02-20 NOTE — Patient Instructions (Signed)
Increase Wellbutrin to 450 mg daily Stop Flexeril Begin Robaxin 500 mg up to 4 times daily for back pain/muscle spasms We will call you with labs  Follow-up in 28-months, fasting   Managing Depression, Adult Depression is a mental health condition that affects your thoughts, feelings, and actions. Being diagnosed with depression can bring you relief if you did not know why you have felt or behaved a certain way. It could also leave you feeling overwhelmed with uncertainty about your future. Preparing yourself to manage your symptoms can help you feel more positive about your future. How to manage lifestyle changes Managing stress  Stress is your body's reaction to life changes and events, both good and bad. Stress can add to your feelings of depression. Learning to manage your stress can help lessen your feelings of depression. Try some of the following approaches to reducing your stress (stress reduction techniques): Listen to music that you enjoy and that inspires you. Try using a meditation app or take a meditation class. Develop a practice that helps you connect with your spiritual self. Walk in nature, pray, or go to a place of worship. Do some deep breathing. To do this, inhale slowly through your nose. Pause at the top of your inhale for a few seconds and then exhale slowly, letting your muscles relax. Practice yoga to help relax and work your muscles. Choose a stress reduction technique that suits your lifestyle and personality. These techniques take time and practice to develop. Set aside 5-15 minutes a day to do them. Therapists can offer training in these techniques. Other things you can do to manage stress include: Keeping a stress diary. Knowing your limits and saying no when you think something is too much. Paying attention to how you react to certain situations. You may not be able to control everything, but you can change your reaction. Adding humor to your life by watching  funny films or TV shows. Making time for activities that you enjoy and that relax you.  Medicines Medicines, such as antidepressants, are often a part of treatment for depression. Talk with your pharmacist or health care provider about all the medicines, supplements, and herbal products that you take, their possible side effects, and what medicines and other products are safe to take together. Make sure to report any side effects you may have to your health care provider. Relationships Your health care provider may suggest family therapy, couples therapy, or individual therapy as part of your treatment. How to recognize changes Everyone responds differently to treatment for depression. As you recover from depression, you may start to: Have more interest in doing activities. Feel less hopeless. Have more energy. Overeat less often, or have a better appetite. Have better mental focus. It is important to recognize if your depression is not getting better or is getting worse. The symptoms you had in the beginning may return, such as: Tiredness (fatigue) or low energy. Eating too much or too little. Sleeping too much or too little. Feeling restless, agitated, or hopeless. Trouble focusing or making decisions. Unexplained physical complaints. Feeling irritable, angry, or aggressive. If you or your family members notice these symptoms coming back, let your health care provider know right away. Follow these instructions at home: Activity  Try to get some form of exercise each day, such as walking, biking, swimming, or lifting weights. Practice stress reduction techniques. Engage your mind by taking a class or doing some volunteer work. Lifestyle Get the right amount and quality of sleep.  Cut down on using caffeine, tobacco, alcohol, and other potentially harmful substances. Eat a healthy diet that includes plenty of vegetables, fruits, whole grains, low-fat dairy products, and lean protein.  Do not eat a lot of foods that are high in solid fats, added sugars, or salt (sodium). General instructions Take over-the-counter and prescription medicines only as told by your health care provider. Keep all follow-up visits as told by your health care provider. This is important. Where to find support Talking to others  Friends and family members can be sources of support and guidance. Talk to trusted friends or family members about your condition. Explain your symptoms to them, and let them know that you are working with a health care provider to treat your depression. Tell friends and family members how they also can be helpful. Finances Find appropriate mental health providers that fit with your financial situation. Talk with your health care provider about options to get reduced prices on your medicines. Where to find more information You can find support in your area from: Anxiety and Depression Association of America (ADAA): www.adaa.org Mental Health America: www.mentalhealthamerica.net [employer] on Mental Illness: www.nami.org Contact a health care provider if: You stop taking your antidepressant medicines, and you have any of these symptoms: Nausea. Headache. Light-headedness. Chills and body aches. Not being able to sleep (insomnia). You or your friends and family think your depression is getting worse. Get help right away if: You have thoughts of hurting yourself or others. If you ever feel like you may hurt yourself or others, or have thoughts about taking your own life, get help right away. Go to your nearest emergency department or: Call your local emergency services (911 in the [place] Call a suicide crisis helpline, such as the [place] at (951) 374-8018 or 988 in the [place]. This is open 24 hours a day in the [place] Text the Crisis Text Line at (302) 157-1903 (in the [place].). Summary If you are diagnosed with depression, preparing yourself to  manage your symptoms is a good way to feel positive about your future. Work with your health care provider on a management plan that includes stress reduction techniques, medicines (if applicable), therapy, and healthy lifestyle habits. Keep talking with your health care provider about how your treatment is working. If you have thoughts about taking your own life, call a suicide crisis helpline or text a crisis text line. This information is not intended to replace advice given to you by your health care provider. Make sure you discuss any questions you have with your health care provider. Document Revised: 12/15/2020 Document Reviewed: 04/02/2019 Elsevier Patient Education  [place].

## 2022-02-20 NOTE — Addendum Note (Signed)
Addended by: Rip Harbour on: 02/20/2022 04:23 PM   Modules accepted: Orders

## 2022-02-21 LAB — CBC WITH DIFFERENTIAL/PLATELET
Basophils Absolute: 0 10*3/uL (ref 0.0–0.2)
Basos: 0 %
EOS (ABSOLUTE): 0.1 10*3/uL (ref 0.0–0.4)
Eos: 1 %
Hematocrit: 40.7 % (ref 34.0–46.6)
Hemoglobin: 13.3 g/dL (ref 11.1–15.9)
Immature Grans (Abs): 0 10*3/uL (ref 0.0–0.1)
Immature Granulocytes: 0 %
Lymphocytes Absolute: 1.6 10*3/uL (ref 0.7–3.1)
Lymphs: 22 %
MCH: 29 pg (ref 26.6–33.0)
MCHC: 32.7 g/dL (ref 31.5–35.7)
MCV: 89 fL (ref 79–97)
Monocytes Absolute: 0.5 10*3/uL (ref 0.1–0.9)
Monocytes: 7 %
Neutrophils Absolute: 4.8 10*3/uL (ref 1.4–7.0)
Neutrophils: 70 %
Platelets: 255 10*3/uL (ref 150–450)
RBC: 4.59 x10E6/uL (ref 3.77–5.28)
RDW: 12.8 % (ref 11.7–15.4)
WBC: 7 10*3/uL (ref 3.4–10.8)

## 2022-02-21 LAB — COMPREHENSIVE METABOLIC PANEL
ALT: 22 IU/L (ref 0–32)
AST: 14 IU/L (ref 0–40)
Albumin/Globulin Ratio: 2.4 — ABNORMAL HIGH (ref 1.2–2.2)
Albumin: 4.7 g/dL (ref 3.9–4.9)
Alkaline Phosphatase: 80 IU/L (ref 44–121)
BUN/Creatinine Ratio: 20 (ref 9–23)
BUN: 19 mg/dL (ref 6–20)
Bilirubin Total: 0.2 mg/dL (ref 0.0–1.2)
CO2: 21 mmol/L (ref 20–29)
Calcium: 9.4 mg/dL (ref 8.7–10.2)
Chloride: 103 mmol/L (ref 96–106)
Creatinine, Ser: 0.93 mg/dL (ref 0.57–1.00)
Globulin, Total: 2 g/dL (ref 1.5–4.5)
Glucose: 96 mg/dL (ref 70–99)
Potassium: 4.4 mmol/L (ref 3.5–5.2)
Sodium: 139 mmol/L (ref 134–144)
Total Protein: 6.7 g/dL (ref 6.0–8.5)
eGFR: 80 mL/min/{1.73_m2} (ref >59)

## 2022-02-21 LAB — LIPID PANEL
Chol/HDL Ratio: 4 ratio (ref 0.0–4.4)
Cholesterol, Total: 178 mg/dL (ref 100–199)
HDL: 44 mg/dL (ref >39)
LDL Chol Calc (NIH): 102 mg/dL — ABNORMAL HIGH (ref 0–99)
Triglycerides: 182 mg/dL — ABNORMAL HIGH (ref 0–149)
VLDL Cholesterol Cal: 32 mg/dL (ref 5–40)

## 2022-02-21 LAB — CARDIOVASCULAR RISK ASSESSMENT

## 2022-02-21 LAB — SARS-COV-2 ANTIBODIES: SARS-CoV-2 Antibodies: POSITIVE

## 2022-02-23 ENCOUNTER — Other Ambulatory Visit: Payer: Self-pay

## 2022-02-23 DIAGNOSIS — F900 Attention-deficit hyperactivity disorder, predominantly inattentive type: Secondary | ICD-10-CM

## 2022-02-23 MED ORDER — LISDEXAMFETAMINE DIMESYLATE 40 MG PO CAPS: 40.0000 mg | ORAL_CAPSULE | Freq: Every day | ORAL | 0 refills | Status: DC

## 2022-02-23 NOTE — Telephone Encounter (Signed)
Patient needs vyvanse sent to different pharmacy due to back order. New pharmacy was placed in the request.

## 2022-03-27 ENCOUNTER — Other Ambulatory Visit: Payer: Self-pay | Admitting: Nurse Practitioner

## 2022-03-27 DIAGNOSIS — F900 Attention-deficit hyperactivity disorder, predominantly inattentive type: Secondary | ICD-10-CM

## 2022-03-27 MED ORDER — LISDEXAMFETAMINE DIMESYLATE 40 MG PO CAPS: 40.0000 mg | ORAL_CAPSULE | Freq: Every day | ORAL | 0 refills | Status: DC

## 2022-05-09 ENCOUNTER — Other Ambulatory Visit: Payer: Self-pay | Admitting: Nurse Practitioner

## 2022-05-09 DIAGNOSIS — F900 Attention-deficit hyperactivity disorder, predominantly inattentive type: Secondary | ICD-10-CM

## 2022-05-10 MED ORDER — LISDEXAMFETAMINE DIMESYLATE 40 MG PO CAPS: 40.0000 mg | ORAL_CAPSULE | Freq: Every day | ORAL | 0 refills | Status: DC

## 2022-05-16 ENCOUNTER — Other Ambulatory Visit: Payer: Self-pay | Admitting: Nurse Practitioner

## 2022-05-16 DIAGNOSIS — M545 Low back pain, unspecified: Secondary | ICD-10-CM

## 2022-05-23 ENCOUNTER — Encounter: Payer: Self-pay | Admitting: Nurse Practitioner

## 2022-05-23 ENCOUNTER — Ambulatory Visit: Payer: Commercial Managed Care - PPO | Admitting: Nurse Practitioner

## 2022-05-23 VITALS — BP 132/86 | HR 97 | Temp 97.1°F | Ht 67.0 in | Wt 247.0 lb

## 2022-05-23 DIAGNOSIS — E782 Mixed hyperlipidemia: Secondary | ICD-10-CM | POA: Diagnosis not present

## 2022-05-23 DIAGNOSIS — G43709 Chronic migraine without aura, not intractable, without status migrainosus: Secondary | ICD-10-CM

## 2022-05-23 DIAGNOSIS — E66812 Obesity, class 2: Secondary | ICD-10-CM | POA: Insufficient documentation

## 2022-05-23 DIAGNOSIS — F330 Major depressive disorder, recurrent, mild: Secondary | ICD-10-CM

## 2022-05-23 DIAGNOSIS — F900 Attention-deficit hyperactivity disorder, predominantly inattentive type: Secondary | ICD-10-CM

## 2022-05-23 DIAGNOSIS — Z6838 Body mass index (BMI) 38.0-38.9, adult: Secondary | ICD-10-CM | POA: Insufficient documentation

## 2022-05-23 MED ORDER — LISDEXAMFETAMINE DIMESYLATE 50 MG PO CAPS: 50.0000 mg | ORAL_CAPSULE | Freq: Every day | ORAL | 0 refills | Status: DC

## 2022-05-23 MED ORDER — QULIPTA 60 MG PO TABS: 60.0000 mg | ORAL_TABLET | Freq: Every day | ORAL | 3 refills | Status: DC

## 2022-05-23 MED ORDER — ZEPBOUND 2.5 MG/0.5ML ~~LOC~~ SOAJ: 2.5000 mg | SUBCUTANEOUS | 1 refills | Status: DC

## 2022-05-23 MED ORDER — QULIPTA 60 MG PO TABS: 60.0000 mg | ORAL_TABLET | Freq: Every day | ORAL | 0 refills | Status: DC

## 2022-05-23 NOTE — Progress Notes (Signed)
Subjective:  Patient ID: Lauren Faulkner, female    DOB: 03/15/1981  Age: 41 y.o. MRN: [id number]  Chief Complaint  Patient presents with   ADHD    HPI  Pt presents for follow-up of hyperlipidemia, ADHD, and depression. Rayme reports chronic migraines have worsened. States she has experienced migraines since childhood. Current treated with abortive medication, Maxalt. States she having migraine headaches. She is up-to-date on routine eye exam, wears prescription glasses. She has identified some food triggers such as processed cheeses, smoked meats, and chocolate. She was previously prescribed Topamax for migraine prevention.   Lipid/Cholesterol, Follow-up  Last lipid panel Other pertinent labs  Lab Results  Component Value Date   CHOL 178 02/20/2022   HDL 44 02/20/2022   LDLCALC 102 (H) 02/20/2022   TRIG 182 (H) 02/20/2022   CHOLHDL 4.0 02/20/2022   Lab Results  Component Value Date   ALT 22 02/20/2022   AST 14 02/20/2022   PLT 255 02/20/2022   TSH 1.440 08/10/2021     She was last seen for this 3 months ago.  Management includes diet and exercise.  She reports excellent compliance with treatment. She is not having side effects.    ADHD: Taking vyvanse 40 mg  daily. States symptoms are not well controlled at this time. States she has some difficulty concentrating intermittently. She has requested an increase in dosage. Denies side effects of medication.  Depression, Follow-up  Current treatment includes wellbutrin 450 mg daily.  She reports excellent compliance with treatment. She is not having side effects.   She reports excellent tolerance of treatment. Current symptoms include: difficulty concentrating and fatigue She feels she is Worse since last visit.     05/23/2022    9:58 AM 02/20/2022    7:52 AM 11/16/2021   11:30 AM  Depression screen PHQ 2/9  Decreased Interest 2 1 1   Down, Depressed, Hopeless 1 1 1   PHQ - 2 Score 3 2 2   Altered sleeping 0 0 3   Tired, decreased energy 3 3 2   Change in appetite 2 1 1   Feeling bad or failure about yourself  0 1 1  Trouble concentrating 3 1 3   Moving slowly or fidgety/restless 3 1 0  Suicidal thoughts 0 0 0  PHQ-9 Score 14 9 12   Difficult doing work/chores Somewhat difficult Somewhat difficult Somewhat difficult       Current Outpatient Medications on File Prior to Visit  Medication Sig Dispense Refill   buPROPion (WELLBUTRIN XL) 150 MG 24 hr tablet Take 1 tablet (150 mg total) by mouth daily. 90 tablet 1   buPROPion (WELLBUTRIN XL) 300 MG 24 hr tablet Take 1 tablet (300 mg total) by mouth daily. 90 tablet 1   cholecalciferol (VITAMIN D) 1000 units tablet Take 1,000 Units by mouth daily.     cyclobenzaprine (FLEXERIL) 5 MG tablet Take 1 tablet (5 mg total) by mouth 3 (three) times daily as needed for muscle spasms. 30 tablet 1   lidocaine (LIDODERM) 5 % Place 1 patch onto the skin daily. Remove & Discard patch within 12 hours or as directed by MD 30 patch 2   lisdexamfetamine (VYVANSE) 40 MG capsule Take 1 capsule (40 mg total) by mouth daily. 30 capsule 0   methocarbamol (ROBAXIN) 500 MG tablet Take 1 tablet (500 mg total) by mouth every 6 (six) hours as needed for muscle spasms. 180 tablet 1   Multiple Vitamin (MULTIVITAMIN) tablet Take 1 tablet by mouth daily.  ondansetron (ZOFRAN-ODT) 8 MG disintegrating tablet DISSOLVE ONE TABLET BY MOUTH EVERY 6 HOURS AS NEEDED 30 tablet 1   rizatriptan (MAXALT) 10 MG tablet Take 1 tablet (10 mg total) by mouth as needed for migraine. May repeat in 2 hours if needed 10 tablet 2   No current facility-administered medications on file prior to visit.   Past Medical History:  Diagnosis Date   Adenomyosis    fibroadenomoa R breast   Hypothyroid    Insomnia    Interstitial cystitis    Migraines    Vitamin D deficiency    Past Surgical History:  Procedure Laterality Date   CERVICAL CONE BIOPSY      Family History  Problem Relation Age of Onset    Diabetes Mother    Hyperlipidemia Mother    Fibroids Mother    Diabetes Father    Hypertension Father    Hyperlipidemia Father    Ovarian cancer Maternal Grandmother    Stroke Maternal Grandmother    Social History   Socioeconomic History   Marital status: Married    Spouse name: Not on file   Number of children: Not on file   Years of education: Not on file   Highest education level: Not on file  Occupational History   Not on file  Tobacco Use   Smoking status: Former    Types: Cigarettes    Quit date: 06/05/2002    Years since quitting: 19.9   Smokeless tobacco: Never  Substance and Sexual Activity   Alcohol use: Yes    Comment: occ   Drug use: No   Sexual activity: Not on file  Other Topics Concern   Not on file  Social History Narrative   Pt is nurse, employed at [employer].  Married.  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   Caffeine  1 cup per day.     Social Determinants of Health   Financial Resource Strain: Low Risk  (08/10/2021)   Overall Financial Resource Strain (CARDIA)    Difficulty of Paying Living Expenses: Not hard at all  Food Insecurity: No Food Insecurity (08/10/2021)   Hunger Vital Sign    Worried About Running Out of Food in the Last Year: Never true    Ran Out of Food in the Last Year: Never true  Transportation Needs: No Transportation Needs (08/10/2021)   PRAPARE - Administrator, Civil Service (Medical): No    Lack of Transportation (Non-Medical): No  Physical Activity: Insufficiently Active (08/10/2021)   Exercise Vital Sign    Days of Exercise per Week: 3 days    Minutes of Exercise per Session: 30 min  Stress: No Stress Concern Present (08/10/2021)   [employer] of Occupational Health - Occupational Stress Questionnaire    Feeling of Stress : Not at all  Social Connections: Moderately Isolated (08/10/2021)   Social Connection and Isolation Panel [NHANES]    Frequency of Communication with Friends and Family: Three times a week    Frequency of  Social Gatherings with Friends and Family: Three times a week    Attends Religious Services: Never    Active Member of Clubs or Organizations: No    Attends Banker Meetings: Never    Marital Status: Married    Review of Systems  Constitutional:  Negative for chills, fatigue and fever.  HENT:  Negative for congestion, ear pain, rhinorrhea and sore throat.   Respiratory:  Negative for cough and shortness of breath.   Cardiovascular:  Negative for  chest pain.  Gastrointestinal:  Negative for abdominal pain, constipation, diarrhea, nausea and vomiting.  Genitourinary:  Negative for dysuria and urgency.  Musculoskeletal:  Positive for back pain. Negative for myalgias.  Neurological:  Negative for dizziness, weakness, light-headedness and headaches.  Psychiatric/Behavioral:  Negative for dysphoric mood. The patient is not nervous/anxious.      Objective:  BP 132/86   Pulse 97   Temp (!) 97.1 F (36.2 C)   Ht 5\' 7"  (1.702 m)   Wt 247 lb (112 kg)   SpO2 98%   BMI 38.69 kg/m       05/23/2022    9:57 AM 02/20/2022    7:49 AM 11/16/2021   11:27 AM  BP/Weight  Systolic BP  122 11/18/2021  Diastolic BP  70 74  Wt. (Lbs) 247 239 246  BMI 38.69 kg/m2 37.43 kg/m2 39.71 kg/m2    Physical Exam Vitals reviewed.  Constitutional:      Appearance: She is obese.  HENT:     Head: Normocephalic.     Right Ear: Tympanic membrane normal.     Left Ear: Tympanic membrane normal.     Nose: Nose normal.     Mouth/Throat:     Mouth: Mucous membranes are moist.  Eyes:     Pupils: Pupils are equal, round, and reactive to light.  Cardiovascular:     Rate and Rhythm: Normal rate and regular rhythm.  Pulmonary:     Effort: Pulmonary effort is normal.     Breath sounds: Normal breath sounds.  Abdominal:     General: Bowel sounds are normal.     Palpations: Abdomen is soft.  Musculoskeletal:        General: Normal range of motion.     Cervical back: Neck supple.  Skin:    General:  Skin is warm and dry.     Capillary Refill: Capillary refill takes less than 2 seconds.  Neurological:     General: No focal deficit present.     Mental Status: She is alert and oriented to person, place, and time.  Psychiatric:        Mood and Affect: Mood normal.        Behavior: Behavior normal.        Lab Results  Component Value Date   WBC 7.0 02/20/2022   HGB 13.3 02/20/2022   HCT 40.7 02/20/2022   PLT 255 02/20/2022   GLUCOSE 96 02/20/2022   CHOL 178 02/20/2022   TRIG 182 (H) 02/20/2022   HDL 44 02/20/2022   LDLCALC 102 (H) 02/20/2022   ALT 22 02/20/2022   AST 14 02/20/2022   NA 139 02/20/2022   K 4.4 02/20/2022   CL 103 02/20/2022   CREATININE 0.93 02/20/2022   BUN 19 02/20/2022   CO2 21 02/20/2022   TSH 1.440 08/10/2021   HGBA1C 5.5 08/10/2021      Assessment & Plan:   1. Mixed hyperlipidemia-well controlled -continue heart healthy diet -increase physical activity  2. Chronic migraine without aura without status migrainosus, not intractable-not at goal - Atogepant (QULIPTA) 60 MG TABS; Take 60 mg by mouth daily.  Dispense: 8 tablet; Refill: 0 - Atogepant (QULIPTA) 60 MG TABS; Take 60 mg by mouth daily.  Dispense: 30 tablet; Refill: 3 -avoid migraine triggers  3. Mild recurrent major depression (HCC)-not at goal -recommend counseling -continue Wellbutrin 450 mg QD  4. Attention deficit hyperactivity disorder (ADHD), predominantly inattentive type-not at goal - lisdexamfetamine (VYVANSE) 50 MG capsule; Take 1 capsule (  50 mg total) by mouth daily.  Dispense: 30 capsule; Refill: 0 -discontinue Vyvanse 40 mg QD  5. BMI 38.0-38.9,adult -increase physical activity  6. Class 2 severe obesity due to excess calories with serious comorbidity and body mass index (BMI) of 38.0 to 38.9 in adult [facility](HCC) - Tirzepatide-Weight Management (ZEPBOUND) 2.5 MG/0.5ML SOAJ; Inject 2.5 mg into the skin once a week.  Dispense: 2 mL; Refill: 1  -increase physical activity   -low fat, heart healthy diet   We will call you with lab results Begin Qulipta 60 mg daily to prevent migraine headaches Notify office immediately of any adverse side effects Increase Vyvanse to 50 mg daily Follow-up in 583-months    Follow-up: 983-months   An After Visit Summary was printed and given to the patient.  I, Janie MorningShannon J Sahana Boyland, NP, have reviewed all documentation for this visit. The documentation on 05/23/22 for the exam, diagnosis, procedures, and orders are all accurate and complete.    Signed, Janie MorningShannon J Nura Cahoon, NP [facility] [phone]

## 2022-05-23 NOTE — Patient Instructions (Signed)
We will call you with lab results Begin Qulipta 60 mg daily to prevent migraine headaches Notify office immediately of any adverse side effects Increase Vyvanse to 50 mg daily Follow-up in 66-months  Atogepant Tablets What is this medication? ATOGEPANT (a TOE je pant) prevents migraines. It works by blocking a substance in the body that causes migraines. This medicine may be used for other purposes; ask your health care provider or pharmacist if you have questions. COMMON BRAND NAME(S): QULIPTA What should I tell my care team before I take this medication? They need to know if you have any of these conditions: Kidney disease Liver disease An unusual or allergic reaction to atogepant, other medications, foods, dyes, or preservatives Pregnant or trying to get pregnant Breast-feeding How should I use this medication? Take this medication by mouth with water. Take it as directed on the prescription label at the same time every day. You can take it with or without food. If it upsets your stomach, take it with food. Keep taking it unless your care team tells you to stop. Talk to your care team about the use of this medication in children. Special care may be needed. Overdosage: If you think you have taken too much of this medicine contact a poison control center or emergency room at once. NOTE: This medicine is only for you. Do not share this medicine with others. What if I miss a dose? If you miss a dose, take it as soon as you can. If it is almost time for your next dose, take only that dose. Do not take double or extra doses. What may interact with this medication? Carbamazepine Certain medications for fungal infections, such as itraconazole, ketoconazole Clarithromycin Cyclosporine Efavirenz Etravirine Phenytoin Rifampin St. John's wort This list may not describe all possible interactions. Give your health care provider a list of all the medicines, herbs, non-prescription drugs, or  dietary supplements you use. Also tell them if you smoke, drink alcohol, or use illegal drugs. Some items may interact with your medicine. What should I watch for while using this medication? Visit your care team for regular checks on your progress. Tell your care team if your symptoms do not start to get better or if they get worse. What side effects may I notice from receiving this medication? Side effects that you should report to your care team as soon as possible: Allergic reactions--skin rash, itching, hives, swelling of the face, lips, tongue, or throat Side effects that usually do not require medical attention (report to your care team if they continue or are bothersome): Constipation Fatigue Loss of appetite with weight loss Nausea This list may not describe all possible side effects. Call your doctor for medical advice about side effects. You may report side effects to FDA at 1-800-FDA-1088. Where should I keep my medication? Keep out of the reach of children and pets. Store at room temperature between 20 and 25 degrees C (68 and 77 degrees F). Get rid of any unused medication after the expiration date. To get rid of medications that are no longer needed or have expired: Take the medication to a medication take-back program. Check with your pharmacy or law enforcement to find a location. If you cannot return the medication, check the label or package insert to see if the medication should be thrown out in the garbage or flushed down the toilet. If you are not sure, ask your care team. If it is safe to put it in the trash, take the  medication out of the container. Mix the medication with cat litter, dirt, coffee grounds, or other unwanted substance. Seal the mixture in a bag or container. Put it in the trash. NOTE: This sheet is a summary. It may not cover all possible information. If you have questions about this medicine, talk to your doctor, pharmacist, or health care provider.  2023  Elsevier/Gold Standard (2020-03-08 00:00:00)   Migraine Headache A migraine headache is a very strong throbbing pain on one side or both sides of your head. This type of headache can also cause other symptoms. It can last from 4 hours to 3 days. Talk with your doctor about what things may bring on (trigger) this condition. What are the causes? The exact cause of this condition is not known. This condition may be triggered or caused by: Drinking alcohol. Smoking. Taking medicines, such as: Medicine used to treat chest pain (nitroglycerin). Birth control pills. Estrogen. Some blood pressure medicines. Eating or drinking certain products. Doing physical activity. Other things that may trigger a migraine headache include: Having a menstrual period. Pregnancy. Hunger. Stress. Not getting enough sleep or getting too much sleep. Weather changes. Tiredness (fatigue). What increases the risk? Being 70-30 years old. Being female. Having a family history of migraine headaches. Being Caucasian. Having depression or anxiety. Being very overweight. What are the signs or symptoms? A throbbing pain. This pain may: Happen in any area of the head, such as on one side or both sides. Make it hard to do daily activities. Get worse with physical activity. Get worse around bright lights or loud noises. Other symptoms may include: Feeling sick to your stomach (nauseous). Vomiting. Dizziness. Being sensitive to bright lights, loud noises, or smells. Before you get a migraine headache, you may get warning signs (an aura). An aura may include: Seeing flashing lights or having blind spots. Seeing bright spots, halos, or zigzag lines. Having tunnel vision or blurred vision. Having numbness or a tingling feeling. Having trouble talking. Having weak muscles. Some people have symptoms after a migraine headache (postdromal phase), such as: Tiredness. Trouble thinking (concentrating). How is this  treated? Taking medicines that: Relieve pain. Relieve the feeling of being sick to your stomach. Prevent migraine headaches. Treatment may also include: Having acupuncture. Avoiding foods that bring on migraine headaches. Learning ways to control your body functions (biofeedback). Therapy to help you know and deal with negative thoughts (cognitive behavioral therapy). Follow these instructions at home: Medicines Take over-the-counter and prescription medicines only as told by your doctor. Ask your doctor if the medicine prescribed to you: Requires you to avoid driving or using heavy machinery. Can cause trouble pooping (constipation). You may need to take these steps to prevent or treat trouble pooping: Drink enough fluid to keep your pee (urine) pale yellow. Take over-the-counter or prescription medicines. Eat foods that are high in fiber. These include beans, whole grains, and fresh fruits and vegetables. Limit foods that are high in fat and sugar. These include fried or sweet foods. Lifestyle Do not drink alcohol. Do not use any products that contain nicotine or tobacco, such as cigarettes, e-cigarettes, and chewing tobacco. If you need help quitting, ask your doctor. Get at least 8 hours of sleep every night. Limit and deal with stress. General instructions Keep a journal to find out what may bring on your migraine headaches. For example, write down: What you eat and drink. How much sleep you get. Any change in what you eat or drink. Any change in your  medicines. If you have a migraine headache: Avoid things that make your symptoms worse, such as bright lights. It may help to lie down in a dark, quiet room. Do not drive or use heavy machinery. Ask your doctor what activities are safe for you. Keep all follow-up visits as told by your doctor. This is important. Contact a doctor if: You get a migraine headache that is different or worse than others you have had. You have more  than 15 headache days in one month. Get help right away if: Your migraine headache gets very bad. Your migraine headache lasts longer than 72 hours. You have a fever. You have a stiff neck. You have trouble seeing. Your muscles feel weak or like you cannot control them. You start to lose your balance a lot. You start to have trouble walking. You pass out (faint). You have a seizure. Summary A migraine headache is a very strong throbbing pain on one side or both sides of your head. These headaches can also cause other symptoms. This condition may be treated with medicines and changes to your lifestyle. Keep a journal to find out what may bring on your migraine headaches. Contact a doctor if you get a migraine headache that is different or worse than others you have had. Contact your doctor if you have more than 15 headache days in a month. This information is not intended to replace advice given to you by your health care provider. Make sure you discuss any questions you have with your health care provider. Document Revised: 11/03/2021 Document Reviewed: 07/04/2018 Elsevier Patient Education  2023 [employer].

## 2022-05-24 ENCOUNTER — Telehealth: Payer: Self-pay

## 2022-05-24 NOTE — Telephone Encounter (Signed)
PA submitted and approved via covermymeds for Qulipta. 

## 2022-05-31 ENCOUNTER — Other Ambulatory Visit: Payer: Self-pay | Admitting: Nurse Practitioner

## 2022-05-31 DIAGNOSIS — M545 Low back pain, unspecified: Secondary | ICD-10-CM

## 2022-06-13 ENCOUNTER — Other Ambulatory Visit: Payer: Self-pay

## 2022-06-13 ENCOUNTER — Telehealth: Payer: Self-pay

## 2022-06-13 ENCOUNTER — Other Ambulatory Visit: Payer: Self-pay | Admitting: Nurse Practitioner

## 2022-06-13 ENCOUNTER — Encounter: Payer: Self-pay | Admitting: Nurse Practitioner

## 2022-06-13 DIAGNOSIS — G43709 Chronic migraine without aura, not intractable, without status migrainosus: Secondary | ICD-10-CM

## 2022-06-13 DIAGNOSIS — M545 Low back pain, unspecified: Secondary | ICD-10-CM

## 2022-06-13 MED ORDER — QULIPTA 60 MG PO TABS: 60.0000 mg | ORAL_TABLET | Freq: Every day | ORAL | 3 refills | Status: DC

## 2022-06-13 NOTE — Telephone Encounter (Signed)
PA submitted via covermymeds for zepbound and denied. Per covermymeds medication is not covered by patients plan.

## 2022-06-19 ENCOUNTER — Other Ambulatory Visit: Payer: Self-pay

## 2022-06-19 DIAGNOSIS — F900 Attention-deficit hyperactivity disorder, predominantly inattentive type: Secondary | ICD-10-CM

## 2022-06-19 DIAGNOSIS — G43709 Chronic migraine without aura, not intractable, without status migrainosus: Secondary | ICD-10-CM

## 2022-06-19 MED ORDER — BUPROPION HCL ER (XL) 300 MG PO TB24: 300.0000 mg | ORAL_TABLET | Freq: Every day | ORAL | 0 refills | Status: DC

## 2022-06-19 MED ORDER — BUPROPION HCL ER (XL) 150 MG PO TB24: 150.0000 mg | ORAL_TABLET | Freq: Every day | ORAL | 0 refills | Status: DC

## 2022-06-19 MED ORDER — LISDEXAMFETAMINE DIMESYLATE 50 MG PO CAPS: 50.0000 mg | ORAL_CAPSULE | Freq: Every day | ORAL | 0 refills | Status: DC

## 2022-06-19 MED ORDER — QULIPTA 60 MG PO TABS: 60.0000 mg | ORAL_TABLET | Freq: Every day | ORAL | 0 refills | Status: DC

## 2022-06-26 ENCOUNTER — Telehealth: Payer: Self-pay

## 2022-06-26 NOTE — Telephone Encounter (Signed)
PA submitted and denied via covermymeds for zepbound. Medications for obesity or appetite suppressants not covered by plan. Refer to "media" for more information regarding denial.

## 2022-06-29 ENCOUNTER — Other Ambulatory Visit: Payer: Self-pay | Admitting: Nurse Practitioner

## 2022-06-29 DIAGNOSIS — F900 Attention-deficit hyperactivity disorder, predominantly inattentive type: Secondary | ICD-10-CM

## 2022-06-30 MED ORDER — LISDEXAMFETAMINE DIMESYLATE 50 MG PO CAPS: 50.0000 mg | ORAL_CAPSULE | Freq: Every day | ORAL | 0 refills | Status: DC

## 2022-07-03 ENCOUNTER — Other Ambulatory Visit: Payer: Self-pay

## 2022-07-03 DIAGNOSIS — F900 Attention-deficit hyperactivity disorder, predominantly inattentive type: Secondary | ICD-10-CM

## 2022-07-03 MED ORDER — LISDEXAMFETAMINE DIMESYLATE 50 MG PO CAPS: 50.0000 mg | ORAL_CAPSULE | Freq: Every day | ORAL | 0 refills | Status: DC

## 2022-07-03 NOTE — Telephone Encounter (Signed)
OT for 2x a week for 2 weeks and than 1x week for 2 weeks Verbal was given to [place] at [facility].

## 2022-07-03 NOTE — Telephone Encounter (Signed)
Medication was sent to wrong pharmacy

## 2022-08-02 ENCOUNTER — Other Ambulatory Visit: Payer: Self-pay | Admitting: Family Medicine

## 2022-08-02 DIAGNOSIS — F900 Attention-deficit hyperactivity disorder, predominantly inattentive type: Secondary | ICD-10-CM

## 2022-08-08 ENCOUNTER — Other Ambulatory Visit: Payer: Self-pay | Admitting: Family Medicine

## 2022-08-08 DIAGNOSIS — F900 Attention-deficit hyperactivity disorder, predominantly inattentive type: Secondary | ICD-10-CM

## 2022-08-08 MED ORDER — LISDEXAMFETAMINE DIMESYLATE 50 MG PO CAPS: 50.0000 mg | ORAL_CAPSULE | Freq: Every day | ORAL | 0 refills | Status: DC

## 2022-08-09 ENCOUNTER — Other Ambulatory Visit: Payer: Self-pay | Admitting: Family Medicine

## 2022-08-09 DIAGNOSIS — F900 Attention-deficit hyperactivity disorder, predominantly inattentive type: Secondary | ICD-10-CM

## 2022-08-09 MED ORDER — LISDEXAMFETAMINE DIMESYLATE 50 MG PO CAPS: 50.0000 mg | ORAL_CAPSULE | Freq: Every day | ORAL | 0 refills | Status: DC

## 2022-08-11 ENCOUNTER — Other Ambulatory Visit: Payer: Self-pay

## 2022-08-14 ENCOUNTER — Telehealth: Payer: Self-pay

## 2022-08-14 NOTE — Telephone Encounter (Signed)
I left a message on the number(s) listed in the patients chart requesting the patient to call back regarding the [facility] appointment for 08/29/2022. Lauren Faulkner's last day with our office is 08/25/2022. The appointment has been canceled. Waiting for the patient to return the call.  NOTE: This appointment can be scheduled with Dr. Tobie Poet or Gay Filler. This appointment can be rescheduled to or around 09/23/2022.

## 2022-08-16 NOTE — Telephone Encounter (Signed)
Appointment has been rescheduled.

## 2022-08-29 ENCOUNTER — Ambulatory Visit: Payer: Commercial Managed Care - PPO | Admitting: Nurse Practitioner

## 2022-08-29 ENCOUNTER — Other Ambulatory Visit: Payer: Self-pay

## 2022-08-29 DIAGNOSIS — M545 Low back pain, unspecified: Secondary | ICD-10-CM

## 2022-08-29 MED ORDER — METHOCARBAMOL 500 MG PO TABS: 500.0000 mg | ORAL_TABLET | Freq: Four times a day (QID) | ORAL | 1 refills | Status: DC | PRN

## 2022-09-07 ENCOUNTER — Other Ambulatory Visit: Payer: Self-pay

## 2022-09-07 MED ORDER — BUPROPION HCL ER (XL) 150 MG PO TB24: 150.0000 mg | ORAL_TABLET | Freq: Every day | ORAL | 0 refills | Status: DC

## 2022-09-11 ENCOUNTER — Other Ambulatory Visit: Payer: Self-pay

## 2022-09-11 DIAGNOSIS — M545 Low back pain, unspecified: Secondary | ICD-10-CM

## 2022-09-11 MED ORDER — METHOCARBAMOL 500 MG PO TABS: 500.0000 mg | ORAL_TABLET | Freq: Four times a day (QID) | ORAL | 1 refills | Status: DC | PRN

## 2022-09-25 ENCOUNTER — Telehealth: Payer: Self-pay

## 2022-09-25 NOTE — Telephone Encounter (Signed)
PA submitted and denied via covermymeds for qulipta "Your PA request has been denied. Additional information will be provided in the denial communication. It is a plan exclusion. The health plan benefit states that prescription drugs not on the drug formulary are excluded from coverage. We reviewed the information we received and it did not allow [place] to approve an exception to this plan exclusion."

## 2022-09-29 ENCOUNTER — Ambulatory Visit: Payer: BC Managed Care – PPO | Admitting: Family Medicine

## 2022-09-29 ENCOUNTER — Encounter: Payer: Self-pay | Admitting: Family Medicine

## 2022-09-29 VITALS — BP 110/74 | HR 84 | Temp 97.3°F | Resp 16 | Ht 66.0 in | Wt 223.0 lb

## 2022-09-29 DIAGNOSIS — E782 Mixed hyperlipidemia: Secondary | ICD-10-CM

## 2022-09-29 DIAGNOSIS — G43709 Chronic migraine without aura, not intractable, without status migrainosus: Secondary | ICD-10-CM

## 2022-09-29 DIAGNOSIS — Z6839 Body mass index (BMI) 39.0-39.9, adult: Secondary | ICD-10-CM

## 2022-09-29 DIAGNOSIS — F900 Attention-deficit hyperactivity disorder, predominantly inattentive type: Secondary | ICD-10-CM

## 2022-09-29 DIAGNOSIS — Z6836 Body mass index (BMI) 36.0-36.9, adult: Secondary | ICD-10-CM

## 2022-09-29 DIAGNOSIS — F330 Major depressive disorder, recurrent, mild: Secondary | ICD-10-CM

## 2022-09-29 MED ORDER — LISDEXAMFETAMINE DIMESYLATE 50 MG PO CAPS: 50.0000 mg | ORAL_CAPSULE | Freq: Every day | ORAL | 0 refills | Status: DC

## 2022-09-29 MED ORDER — BUPROPION HCL ER (XL) 300 MG PO TB24: 300.0000 mg | ORAL_TABLET | Freq: Every day | ORAL | 1 refills | Status: DC

## 2022-09-29 MED ORDER — QULIPTA 60 MG PO TABS: 60.0000 mg | ORAL_TABLET | Freq: Every day | ORAL | 1 refills | Status: DC

## 2022-09-29 MED ORDER — BUPROPION HCL ER (XL) 150 MG PO TB24: 150.0000 mg | ORAL_TABLET | Freq: Every day | ORAL | 1 refills | Status: DC

## 2022-09-29 NOTE — Progress Notes (Signed)
Subjective:  Patient ID: Lauren Faulkner, female    DOB: Dec 15, 1980  Age: 42 y.o. MRN: [id number]  Chief Complaint  Patient presents with   Depression   Migraine    HPI   ADHD, follow-up: Pt has a history of ADHD, inattentive type for several years. Current treatment includes Vyvanse 40 mg QD. States symptoms are currently well-controlled. Denies side effects of treatment. She is adherent to follow-up appointments and medication regimen.   Depression Wellbutrin 150 mg daily, Wellbutrin 300 mg daily.    Chronic migrane Maxalt 10 mg as needed.  Quilpta 60 mg daily.  Mixed Hyperlipidemia: on no medicines.        09/29/2022   10:33 AM 05/23/2022    9:58 AM 02/20/2022    7:52 AM 11/16/2021   11:30 AM 08/10/2021    9:57 AM  Depression screen PHQ 2/9  Decreased Interest 1 2 1 1 2   Down, Depressed, Hopeless 1 1 1 1 1   PHQ - 2 Score 2 3 2 2 3   Altered sleeping 0 0 0 3 0  Tired, decreased energy 1 3 3 2 3   Change in appetite 0 2 1 1  0  Feeling bad or failure about yourself  0 0 1 1 1   Trouble concentrating 1 3 1 3 2   Moving slowly or fidgety/restless 0 3 1 0 2  Suicidal thoughts 0 0 0 0 0  PHQ-9 Score 4 14 9 12 11   Difficult doing work/chores Not difficult at all Somewhat difficult Somewhat difficult Somewhat difficult Somewhat difficult         10/12/2020   10:30 AM 08/10/2021    9:57 AM 02/20/2022    7:51 AM 05/23/2022    9:58 AM  Fall Risk  Falls in the past year? 0 0 0 0  Was there an injury with Fall? 0 0 0 0  Fall Risk Category Calculator 0 0 0 0  Fall Risk Category (Retired) Low Low Low Low  (RETIRED) Patient Fall Risk Level Low fall risk Low fall risk Low fall risk Low fall risk  Patient at Risk for Falls Due to  No Fall Risks No Fall Risks No Fall Risks  Fall risk Follow up Falls evaluation completed Falls evaluation completed Falls evaluation completed Falls evaluation completed      Review of Systems  Constitutional:  Negative for chills, fatigue and fever.   HENT:  Negative for congestion, rhinorrhea and sore throat.   Respiratory:  Negative for cough and shortness of breath.   Cardiovascular:  Negative for chest pain.  Gastrointestinal:  Negative for abdominal pain, constipation, diarrhea, nausea and vomiting.  Genitourinary:  Negative for dysuria and urgency.  Musculoskeletal:  Positive for back pain. Negative for myalgias.  Neurological:  Positive for headaches (6 migranes per month). Negative for dizziness, weakness and light-headedness.  Psychiatric/Behavioral:  Negative for dysphoric mood. The patient is not nervous/anxious.     Current Outpatient Medications on File Prior to Visit  Medication Sig Dispense Refill   cholecalciferol (VITAMIN D) 1000 units tablet Take 1,000 Units by mouth daily.     lidocaine (LIDODERM) 5 % Place 1 patch onto the skin daily. Remove & Discard patch within 12 hours or as directed by MD 30 patch 2   Multiple Vitamin (MULTIVITAMIN) tablet Take 1 tablet by mouth daily.     ondansetron (ZOFRAN-ODT) 8 MG disintegrating tablet DISSOLVE ONE TABLET BY MOUTH EVERY 6 HOURS AS NEEDED 30 tablet 1   rizatriptan (MAXALT) 10 MG tablet Take  1 tablet (10 mg total) by mouth as needed for migraine. May repeat in 2 hours if needed 10 tablet 2   No current facility-administered medications on file prior to visit.   Past Medical History:  Diagnosis Date   Adenomyosis    fibroadenomoa R breast   Hypothyroid    Insomnia    Interstitial cystitis    Migraines    Vitamin D deficiency    Past Surgical History:  Procedure Laterality Date   CERVICAL CONE BIOPSY      Family History  Problem Relation Age of Onset   Diabetes Mother    Hyperlipidemia Mother    Fibroids Mother    Diabetes Father    Hypertension Father    Hyperlipidemia Father    Ovarian cancer Maternal Grandmother    Stroke Maternal Grandmother    Social History   Socioeconomic History   Marital status: Married    Spouse name: Not on file   Number of  children: Not on file   Years of education: Not on file   Highest education level: Not on file  Occupational History   Not on file  Tobacco Use   Smoking status: Former    Types: Cigarettes    Quit date: 06/05/2002    Years since quitting: 20.3   Smokeless tobacco: Never  Substance and Sexual Activity   Alcohol use: Yes    Comment: occ   Drug use: No   Sexual activity: Not on file  Other Topics Concern   Not on file  Social History Narrative   Pt is nurse, 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   Caffeine  1 cup per day.     Social Determinants of Health   Financial Resource Strain: Low Risk  (08/10/2021)   Overall Financial Resource Strain (CARDIA)    Difficulty of Paying Living Expenses: Not hard at all  Food Insecurity: No Food Insecurity (08/10/2021)   Hunger Vital Sign    Worried About Running Out of Food in the Last Year: Never true    Ran Out of Food in the Last Year: Never true  Transportation Needs: No Transportation Needs (08/10/2021)   PRAPARE - Administrator, Civil Service (Medical): No    Lack of Transportation (Non-Medical): No  Physical Activity: Insufficiently Active (08/10/2021)   Exercise Vital Sign    Days of Exercise per Week: 3 days    Minutes of Exercise per Session: 30 min  Stress: No Stress Concern Present (08/10/2021)   [employer] of Occupational Health - Occupational Stress Questionnaire    Feeling of Stress : Not at all  Social Connections: Moderately Isolated (08/10/2021)   Social Connection and Isolation Panel [NHANES]    Frequency of Communication with Friends and Family: Three times a week    Frequency of Social Gatherings with Friends and Family: Three times a week    Attends Religious Services: Never    Active Member of Clubs or Organizations: No    Attends Banker Meetings: Never    Marital Status: Married    Objective:  BP 110/74   Pulse 84   Temp (!) 97.3 F (36.3 C)   Resp 16   Ht 5\' 6"   (1.676 m)   Wt 223 lb (101.2 kg)   BMI 35.99 kg/m      09/29/2022   10:30 AM 05/23/2022    9:57 AM 02/20/2022    7:49 AM  BP/Weight  Systolic BP 110 132  122  Diastolic BP 74 86 70  Wt. (Lbs) 223 247 239  BMI 35.99 kg/m2 38.69 kg/m2 37.43 kg/m2    Physical Exam Vitals reviewed.  Constitutional:      Appearance: Normal appearance. She is normal weight.  Neck:     Vascular: No carotid bruit.  Cardiovascular:     Rate and Rhythm: Normal rate and regular rhythm.     Heart sounds: Normal heart sounds.  Pulmonary:     Effort: Pulmonary effort is normal. No respiratory distress.     Breath sounds: Normal breath sounds.  Abdominal:     General: Abdomen is flat. Bowel sounds are normal.     Palpations: Abdomen is soft.     Tenderness: There is no abdominal tenderness.  Neurological:     Mental Status: She is alert and oriented to person, place, and time.  Psychiatric:        Mood and Affect: Mood normal.        Behavior: Behavior normal.     Diabetic Foot Exam - Simple   No data filed      Lab Results  Component Value Date   WBC 5.8 09/29/2022   HGB 13.0 09/29/2022   HCT 39.4 09/29/2022   PLT 330 09/29/2022   GLUCOSE 98 09/29/2022   CHOL 210 (H) 09/29/2022   TRIG 118 09/29/2022   HDL 48 09/29/2022   LDLCALC 141 (H) 09/29/2022   ALT 19 09/29/2022   AST 12 09/29/2022   NA 140 09/29/2022   K 4.6 09/29/2022   CL 102 09/29/2022   CREATININE 0.94 09/29/2022   BUN 13 09/29/2022   CO2 22 09/29/2022   TSH 1.440 08/10/2021   HGBA1C 5.5 08/10/2021      Assessment & Plan:    Attention deficit hyperactivity disorder (ADHD), predominantly inattentive type Assessment & Plan: The current medical regimen is effective;  continue present plan and medications. Continue vyvanse 40 mg daily.   Orders: -     Lisdexamfetamine Dimesylate; Take 1 capsule (50 mg total) by mouth daily.  Dispense: 30 capsule; Refill: 0  Chronic migraine without aura without status migrainosus,  not intractable Assessment & Plan: Improved.  Continue qlipta 60 mg daily. Continue maxalt for acute treatment.   Orders: Bennie Pierini; Take 1 tablet (60 mg total) by mouth daily.  Dispense: 90 tablet; Refill: 1  Mixed hyperlipidemia Assessment & Plan: Uncontrolled.  Recommend start lipitor 10 mg before bed.  Continue to work on eating a healthy diet and exercise.  Labs drawn today.    Orders: -     Comprehensive metabolic panel -     Lipid panel -     CBC with Differential/Platelet  Class 2 severe obesity due to excess calories with serious comorbidity and body mass index (BMI) of 39.0 to 39.9 in adult (HCC) -     buPROPion HCl ER (XL); Take 1 tablet (150 mg total) by mouth daily.  Dispense: 90 tablet; Refill: 1 -     buPROPion HCl ER (XL); Take 1 tablet (300 mg total) by mouth daily.  Dispense: 90 tablet; Refill: 1  Mild recurrent major depression (HCC) Assessment & Plan: The current medical regimen is effective;  continue present plan and medications. Continue wellbutrin xl 300 mg + 150 mg daily in am.    Class 2 severe obesity due to excess calories with serious comorbidity and body mass index (BMI) of 36.0 to 36.9 in adult [facility]) Assessment & Plan: Improving  Comorbidities: hyperlipidemia and depression. Recommend continue to work on eating healthy diet and exercise.       Meds ordered this encounter  Medications   buPROPion (WELLBUTRIN XL) 150 MG 24 hr tablet    Sig: Take 1 tablet (150 mg total) by mouth daily.    Dispense:  90 tablet    Refill:  1    450 mg total   buPROPion (WELLBUTRIN XL) 300 MG 24 hr tablet    Sig: Take 1 tablet (300 mg total) by mouth daily.    Dispense:  90 tablet    Refill:  1   Atogepant (QULIPTA) 60 MG TABS    Sig: Take 1 tablet (60 mg total) by mouth daily.    Dispense:  90 tablet    Refill:  1   DISCONTD: lisdexamfetamine (VYVANSE) 50 MG capsule    Sig: Take 1 capsule (50 mg total) by mouth daily.    Dispense:  30 capsule     Refill:  0   lisdexamfetamine (VYVANSE) 50 MG capsule    Sig: Take 1 capsule (50 mg total) by mouth daily.    Dispense:  30 capsule    Refill:  0    Orders Placed This Encounter  Procedures   Comprehensive metabolic panel   Lipid panel   CBC with Differential/Platelet     Follow-up: Return in about 3 months (around 12/29/2022) for chronic, Dr. Sedalia Muta, fasting.   I,Carolyn M Morrison,acting as a Neurosurgeon for Blane Ohara, MD.,have documented all relevant documentation on the behalf of Blane Ohara, MD,as directed by  Blane Ohara, MD while in the presence of Blane Ohara, MD.   An After Visit Summary was printed and given to the patient. I attest that I have reviewed this visit and agree with the plan scribed by my staff.   Blane Ohara, MD [facility] [phone]

## 2022-09-30 LAB — COMPREHENSIVE METABOLIC PANEL
ALT: 19 IU/L (ref 0–32)
AST: 12 IU/L (ref 0–40)
Albumin/Globulin Ratio: 2.3 — ABNORMAL HIGH (ref 1.2–2.2)
Albumin: 4.6 g/dL (ref 3.9–4.9)
Alkaline Phosphatase: 79 IU/L (ref 44–121)
BUN/Creatinine Ratio: 14 (ref 9–23)
BUN: 13 mg/dL (ref 6–20)
Bilirubin Total: 0.3 mg/dL (ref 0.0–1.2)
CO2: 22 mmol/L (ref 20–29)
Calcium: 9.8 mg/dL (ref 8.7–10.2)
Chloride: 102 mmol/L (ref 96–106)
Creatinine, Ser: 0.94 mg/dL (ref 0.57–1.00)
Globulin, Total: 2 g/dL (ref 1.5–4.5)
Glucose: 98 mg/dL (ref 70–99)
Potassium: 4.6 mmol/L (ref 3.5–5.2)
Sodium: 140 mmol/L (ref 134–144)
Total Protein: 6.6 g/dL (ref 6.0–8.5)
eGFR: 79 mL/min/{1.73_m2} (ref >59)

## 2022-09-30 LAB — CBC WITH DIFFERENTIAL/PLATELET
Basophils Absolute: 0 10*3/uL (ref 0.0–0.2)
Basos: 1 %
EOS (ABSOLUTE): 0.1 10*3/uL (ref 0.0–0.4)
Eos: 2 %
Hematocrit: 39.4 % (ref 34.0–46.6)
Hemoglobin: 13 g/dL (ref 11.1–15.9)
Immature Grans (Abs): 0 10*3/uL (ref 0.0–0.1)
Immature Granulocytes: 0 %
Lymphocytes Absolute: 1.5 10*3/uL (ref 0.7–3.1)
Lymphs: 26 %
MCH: 29.2 pg (ref 26.6–33.0)
MCHC: 33 g/dL (ref 31.5–35.7)
MCV: 89 fL (ref 79–97)
Monocytes Absolute: 0.4 10*3/uL (ref 0.1–0.9)
Monocytes: 7 %
Neutrophils Absolute: 3.8 10*3/uL (ref 1.4–7.0)
Neutrophils: 64 %
Platelets: 330 10*3/uL (ref 150–450)
RBC: 4.45 x10E6/uL (ref 3.77–5.28)
RDW: 12.6 % (ref 11.7–15.4)
WBC: 5.8 10*3/uL (ref 3.4–10.8)

## 2022-09-30 LAB — LIPID PANEL
Chol/HDL Ratio: 4.4 ratio (ref 0.0–4.4)
Cholesterol, Total: 210 mg/dL — ABNORMAL HIGH (ref 100–199)
HDL: 48 mg/dL (ref >39)
LDL Chol Calc (NIH): 141 mg/dL — ABNORMAL HIGH (ref 0–99)
Triglycerides: 118 mg/dL (ref 0–149)
VLDL Cholesterol Cal: 21 mg/dL (ref 5–40)

## 2022-10-02 ENCOUNTER — Telehealth: Payer: Self-pay

## 2022-10-02 ENCOUNTER — Encounter: Payer: Self-pay | Admitting: Family Medicine

## 2022-10-02 NOTE — Telephone Encounter (Signed)
PA submitted and approved via covermymeds for vyvanse. 

## 2022-10-10 ENCOUNTER — Other Ambulatory Visit: Payer: Self-pay

## 2022-10-10 DIAGNOSIS — M545 Low back pain, unspecified: Secondary | ICD-10-CM

## 2022-10-10 MED ORDER — METHOCARBAMOL 500 MG PO TABS
500.0000 mg | ORAL_TABLET | Freq: Four times a day (QID) | ORAL | 1 refills | Status: DC | PRN
Start: 2022-10-10 — End: 2023-01-25

## 2022-10-14 NOTE — Assessment & Plan Note (Signed)
Improved.  Continue qlipta 60 mg daily. Continue maxalt for acute treatment.

## 2022-10-14 NOTE — Assessment & Plan Note (Signed)
Improving  Comorbidities: hyperlipidemia and depression. Recommend continue to work on eating healthy diet and exercise.

## 2022-10-14 NOTE — Assessment & Plan Note (Signed)
Uncontrolled.  Recommend start lipitor 10 mg before bed.  Continue to work on eating a healthy diet and exercise.  Labs drawn today.

## 2022-10-14 NOTE — Assessment & Plan Note (Signed)
The current medical regimen is effective;  continue present plan and medications. Continue vyvanse 40 mg daily.

## 2022-10-14 NOTE — Assessment & Plan Note (Signed)
The current medical regimen is effective;  continue present plan and medications. Continue wellbutrin xl 300 mg + 150 mg daily in am.

## 2022-11-07 ENCOUNTER — Other Ambulatory Visit: Payer: Self-pay | Admitting: Physician Assistant

## 2022-11-07 DIAGNOSIS — F900 Attention-deficit hyperactivity disorder, predominantly inattentive type: Secondary | ICD-10-CM

## 2022-11-08 MED ORDER — LISDEXAMFETAMINE DIMESYLATE 50 MG PO CAPS: 50.0000 mg | ORAL_CAPSULE | Freq: Every day | ORAL | 0 refills | Status: DC

## 2022-11-09 ENCOUNTER — Other Ambulatory Visit: Payer: Self-pay

## 2022-11-20 ENCOUNTER — Other Ambulatory Visit: Payer: Self-pay | Admitting: Family Medicine

## 2022-11-20 DIAGNOSIS — F900 Attention-deficit hyperactivity disorder, predominantly inattentive type: Secondary | ICD-10-CM

## 2022-11-26 ENCOUNTER — Other Ambulatory Visit: Payer: Self-pay | Admitting: Family Medicine

## 2022-12-21 ENCOUNTER — Other Ambulatory Visit: Payer: Self-pay | Admitting: Family Medicine

## 2022-12-21 DIAGNOSIS — F900 Attention-deficit hyperactivity disorder, predominantly inattentive type: Secondary | ICD-10-CM

## 2022-12-22 MED ORDER — LISDEXAMFETAMINE DIMESYLATE 50 MG PO CAPS
50.0000 mg | ORAL_CAPSULE | Freq: Every day | ORAL | 0 refills | Status: DC
Start: 2022-12-22 — End: 2023-01-20

## 2023-01-11 ENCOUNTER — Encounter: Payer: Self-pay | Admitting: Family Medicine

## 2023-01-12 ENCOUNTER — Other Ambulatory Visit: Payer: Self-pay

## 2023-01-12 DIAGNOSIS — G43709 Chronic migraine without aura, not intractable, without status migrainosus: Secondary | ICD-10-CM

## 2023-01-12 MED ORDER — RIZATRIPTAN BENZOATE 10 MG PO TABS
10.0000 mg | ORAL_TABLET | ORAL | 2 refills | Status: DC | PRN
Start: 2023-01-12 — End: 2023-03-29

## 2023-01-16 ENCOUNTER — Ambulatory Visit: Payer: BC Managed Care – PPO | Admitting: Family Medicine

## 2023-01-20 ENCOUNTER — Other Ambulatory Visit: Payer: Self-pay | Admitting: Family Medicine

## 2023-01-20 DIAGNOSIS — F900 Attention-deficit hyperactivity disorder, predominantly inattentive type: Secondary | ICD-10-CM

## 2023-01-22 MED ORDER — LISDEXAMFETAMINE DIMESYLATE 50 MG PO CAPS: 50.0000 mg | ORAL_CAPSULE | Freq: Every day | ORAL | 0 refills | Status: DC

## 2023-01-25 ENCOUNTER — Other Ambulatory Visit: Payer: Self-pay | Admitting: Family Medicine

## 2023-01-25 DIAGNOSIS — M545 Low back pain, unspecified: Secondary | ICD-10-CM

## 2023-01-29 ENCOUNTER — Encounter: Payer: Self-pay | Admitting: Family Medicine

## 2023-01-29 ENCOUNTER — Ambulatory Visit: Payer: BC Managed Care – PPO | Admitting: Family Medicine

## 2023-01-29 VITALS — BP 124/84 | HR 87 | Temp 97.3°F | Ht 66.0 in | Wt 227.0 lb

## 2023-01-29 DIAGNOSIS — F330 Major depressive disorder, recurrent, mild: Secondary | ICD-10-CM

## 2023-01-29 DIAGNOSIS — G43709 Chronic migraine without aura, not intractable, without status migrainosus: Secondary | ICD-10-CM

## 2023-01-29 DIAGNOSIS — Z6836 Body mass index (BMI) 36.0-36.9, adult: Secondary | ICD-10-CM

## 2023-01-29 DIAGNOSIS — M545 Low back pain, unspecified: Secondary | ICD-10-CM

## 2023-01-29 DIAGNOSIS — R5383 Other fatigue: Secondary | ICD-10-CM | POA: Insufficient documentation

## 2023-01-29 DIAGNOSIS — R400 Somnolence: Secondary | ICD-10-CM | POA: Insufficient documentation

## 2023-01-29 DIAGNOSIS — E782 Mixed hyperlipidemia: Secondary | ICD-10-CM

## 2023-01-29 DIAGNOSIS — F900 Attention-deficit hyperactivity disorder, predominantly inattentive type: Secondary | ICD-10-CM

## 2023-01-29 MED ORDER — METHOCARBAMOL 500 MG PO TABS
500.0000 mg | ORAL_TABLET | Freq: Four times a day (QID) | ORAL | 1 refills | Status: DC | PRN
Start: 2023-01-29 — End: 2023-03-22

## 2023-01-29 NOTE — Assessment & Plan Note (Signed)
Improving  Comorbidities: hyperlipidemia and depression. Recommend continue to work on eating healthy diet and exercise.

## 2023-01-29 NOTE — Patient Instructions (Addendum)
I would recommend a sleep study if nothing indicates a reason for fatigue on your lab work.

## 2023-01-29 NOTE — Assessment & Plan Note (Signed)
Consider sleep study pending labs.

## 2023-01-29 NOTE — Assessment & Plan Note (Signed)
The current medical regimen is effective;  continue present plan and medications. Continue wellbutrin xl 300 mg + 150 mg daily in am.

## 2023-01-29 NOTE — Assessment & Plan Note (Signed)
Management per specialist. 

## 2023-01-29 NOTE — Assessment & Plan Note (Signed)
Qlipita denied.  Continue magnesium otc.  Continue maxalt for acute treatment.

## 2023-01-29 NOTE — Assessment & Plan Note (Addendum)
Await labs/testing for assessment and recommendations. Continue to work on eating a healthy diet and exercise.  Labs drawn today.   

## 2023-01-29 NOTE — Progress Notes (Signed)
Subjective:  Patient ID: Lauren Faulkner, female    DOB: 11-30-1980  Age: 42 y.o. MRN: [id number]  Chief Complaint  Patient presents with   Medical Management of Chronic Issues    HPI   ADHD, follow-up: Pt has a history of ADHD, inattentive type for several years. Current treatment includes Vyvanse 50 mg QD. States symptoms are currently well-controlled. Denies side effects of treatment. She is adherent to follow-up appointments and medication regimen.    Depression Wellbutrin 150 mg daily, Wellbutrin 300 mg daily.     Chronic migraine Maxalt 10 mg as needed. Magnesium has helped. Migraines 3-4 per month.   Mixed Hyperlipidemia: on no medicines. Eating healthy. Trying to exercise. Patient is seeing pain clinic for nerve ablation for low back pain.   Complaining of fatigue. Sleeping well. Snores. Daytime somnolence.       01/29/2023    2:32 PM 02/20/2022    7:51 AM 11/16/2021   11:32 AM 08/10/2021    9:58 AM  GAD 7 : Generalized Anxiety Score  Nervous, Anxious, on Edge 1 2 2 2   Control/stop worrying 1 2 1 3   Worry too much - different things 1 2 1 3   Trouble relaxing 1 0 1 2  Restless 1 0 1 1  Easily annoyed or irritable 2 3 2 3   Afraid - awful might happen 0 0 0 0  Total GAD 7 Score 7 9 8 14   Anxiety Difficulty Somewhat difficult Somewhat difficult Somewhat difficult Somewhat difficult        01/29/2023    2:32 PM 09/29/2022   10:33 AM 05/23/2022    9:58 AM 02/20/2022    7:52 AM 11/16/2021   11:30 AM  Depression screen PHQ 2/9  Decreased Interest 2 1 2 1 1   Down, Depressed, Hopeless 1 1 1 1 1   PHQ - 2 Score 3 2 3 2 2   Altered sleeping 1 0 0 0 3  Tired, decreased energy 3 1 3 3 2   Change in appetite 3 0 2 1 1   Feeling bad or failure about yourself  0 0 0 1 1  Trouble concentrating 2 1 3 1 3   Moving slowly or fidgety/restless 0 0 3 1 0  Suicidal thoughts 0 0 0 0 0  PHQ-9 Score 12 4 14 9 12   Difficult doing work/chores Somewhat difficult Not difficult at all  Somewhat difficult Somewhat difficult Somewhat difficult        01/29/2023    2:32 PM  Fall Risk   Falls in the past year? 0  Number falls in past yr: 0  Injury with Fall? 0  Risk for fall due to : No Fall Risks  Follow up Falls evaluation completed    Patient Care Team: Blane Ohara, MD as PCP - General (Family Medicine) Janalyn Harder, MD (Inactive) as Consulting Physician (Dermatology)   Review of Systems  Constitutional:  Negative for chills, fatigue and fever.  HENT:  Negative for congestion, ear pain, rhinorrhea and sore throat.   Respiratory:  Negative for cough and shortness of breath.   Cardiovascular:  Negative for chest pain.  Gastrointestinal:  Negative for abdominal pain, constipation, diarrhea, nausea and vomiting.  Genitourinary:  Negative for dysuria and urgency.  Musculoskeletal:  Negative for back pain and myalgias.  Neurological:  Negative for dizziness, weakness, light-headedness and headaches.  Psychiatric/Behavioral:  Negative for dysphoric mood. The patient is not nervous/anxious.     Current Outpatient Medications on File Prior to Visit  Medication Sig  Dispense Refill   buPROPion (WELLBUTRIN XL) 150 MG 24 hr tablet Take 1 tablet (150 mg total) by mouth daily. 90 tablet 0   buPROPion (WELLBUTRIN XL) 300 MG 24 hr tablet Take 1 tablet (300 mg total) by mouth daily. 90 tablet 1   cholecalciferol (VITAMIN D) 1000 units tablet Take 1,000 Units by mouth daily.     lidocaine (LIDODERM) 5 % Place 1 patch onto the skin daily. Remove & Discard patch within 12 hours or as directed by MD 30 patch 2   lisdexamfetamine (VYVANSE) 50 MG capsule Take 1 capsule (50 mg total) by mouth daily. 30 capsule 0   Multiple Vitamin (MULTIVITAMIN) tablet Take 1 tablet by mouth daily.     ondansetron (ZOFRAN-ODT) 8 MG disintegrating tablet DISSOLVE ONE TABLET BY MOUTH EVERY 6 HOURS AS NEEDED 30 tablet 1   rizatriptan (MAXALT) 10 MG tablet Take 1 tablet (10 mg total) by mouth as needed  for migraine. May repeat in 2 hours if needed 10 tablet 2   No current facility-administered medications on file prior to visit.   Past Medical History:  Diagnosis Date   Adenomyosis    fibroadenomoa R breast   Hypothyroid    Insomnia    Interstitial cystitis    Migraines    Vitamin D deficiency    Past Surgical History:  Procedure Laterality Date   CERVICAL CONE BIOPSY      Family History  Problem Relation Age of Onset   Diabetes Mother    Hyperlipidemia Mother    Fibroids Mother    Diabetes Father    Hypertension Father    Hyperlipidemia Father    Ovarian cancer Maternal Grandmother    Stroke Maternal Grandmother    Social History   Socioeconomic History   Marital status: Married    Spouse name: Not on file   Number of children: Not on file   Years of education: Not on file   Highest education level: Not on file  Occupational History   Not on file  Tobacco Use   Smoking status: Former    Current packs/day: 0.00    Types: Cigarettes    Quit date: 06/05/2002    Years since quitting: 20.6   Smokeless tobacco: Never  Substance and Sexual Activity   Alcohol use: Yes    Comment: occ   Drug use: No   Sexual activity: Not on file  Other Topics Concern   Not on file  Social History Narrative   Pt is nurse, 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   Caffeine  1 cup per day.     Social Determinants of Health   Financial Resource Strain: Low Risk  (08/10/2021)   Overall Financial Resource Strain (CARDIA)    Difficulty of Paying Living Expenses: Not hard at all  Food Insecurity: No Food Insecurity (08/10/2021)   Hunger Vital Sign    Worried About Running Out of Food in the Last Year: Never true    Ran Out of Food in the Last Year: Never true  Transportation Needs: No Transportation Needs (08/10/2021)   PRAPARE - Administrator, Civil Service (Medical): No    Lack of Transportation (Non-Medical): No  Physical Activity: Insufficiently  Active (08/10/2021)   Exercise Vital Sign    Days of Exercise per Week: 3 days    Minutes of Exercise per Session: 30 min  Stress: No Stress Concern Present (08/10/2021)   [employer] of Occupational Health - Occupational  Stress Questionnaire    Feeling of Stress : Not at all  Social Connections: Moderately Isolated (08/10/2021)   Social Connection and Isolation Panel [NHANES]    Frequency of Communication with Friends and Family: Three times a week    Frequency of Social Gatherings with Friends and Family: Three times a week    Attends Religious Services: Never    Active Member of Clubs or Organizations: No    Attends Banker Meetings: Never    Marital Status: Married    Objective:  BP 124/84   Pulse 87   Temp (!) 97.3 F (36.3 C)   Ht 5\' 6"  (1.676 m)   Wt 227 lb (103 kg)   SpO2 99%   BMI 36.64 kg/m      01/29/2023    2:30 PM 09/29/2022   10:30 AM 05/23/2022    9:57 AM  BP/Weight  Systolic BP 124 110 132  Diastolic BP 84 74 86  Wt. (Lbs) 227 223 247  BMI 36.64 kg/m2 35.99 kg/m2 38.69 kg/m2    Physical Exam Vitals reviewed.  Constitutional:      Appearance: Normal appearance. She is obese.  Neck:     Vascular: No carotid bruit.  Cardiovascular:     Rate and Rhythm: Normal rate and regular rhythm.     Heart sounds: Normal heart sounds.  Pulmonary:     Effort: Pulmonary effort is normal. No respiratory distress.     Breath sounds: Normal breath sounds.  Abdominal:     General: Abdomen is flat. Bowel sounds are normal.     Palpations: Abdomen is soft.     Tenderness: There is no abdominal tenderness.  Neurological:     Mental Status: She is alert and oriented to person, place, and time.  Psychiatric:        Mood and Affect: Mood normal.        Behavior: Behavior normal.     Diabetic Foot Exam - Simple   No data filed      Lab Results  Component Value Date   WBC 5.8 09/29/2022   HGB 13.0 09/29/2022   HCT 39.4 09/29/2022   PLT 330  09/29/2022   GLUCOSE 98 09/29/2022   CHOL 210 (H) 09/29/2022   TRIG 118 09/29/2022   HDL 48 09/29/2022   LDLCALC 141 (H) 09/29/2022   ALT 19 09/29/2022   AST 12 09/29/2022   NA 140 09/29/2022   K 4.6 09/29/2022   CL 102 09/29/2022   CREATININE 0.94 09/29/2022   BUN 13 09/29/2022   CO2 22 09/29/2022   TSH 1.440 08/10/2021   HGBA1C 5.5 08/10/2021      Assessment & Plan:    Mild recurrent major depression (HCC) Assessment & Plan: The current medical regimen is effective;  continue present plan and medications. Continue wellbutrin xl 300 mg + 150 mg daily in am.    Lumbar back pain Assessment & Plan: Management per specialist.    Orders: -     Methocarbamol; Take 1 tablet (500 mg total) by mouth every 6 (six) hours as needed for muscle spasms.  Dispense: 180 tablet; Refill: 1  Class 2 severe obesity due to excess calories with serious comorbidity and body mass index (BMI) of 36.0 to 36.9 in adult [facility]) Assessment & Plan: Improving  Comorbidities: hyperlipidemia and depression. Recommend continue to work on eating healthy diet and exercise.    Mixed hyperlipidemia Assessment & Plan: Await labs/testing for assessment and recommendations. Continue to work on  eating a healthy diet and exercise.  Labs drawn today.    Orders: -     Comprehensive metabolic panel -     Lipid panel -     TSH  Attention deficit hyperactivity disorder (ADHD), predominantly inattentive type Assessment & Plan: The current medical regimen is effective;  continue present plan and medications. Continue vyvanse 50 mg daily.    Other fatigue Assessment & Plan: Consider sleep study pending labs.   Orders: -     CBC with Differential/Platelet  Daytime somnolence Assessment & Plan: Consider sleep study pending labs.    Chronic migraine without aura without status migrainosus, not intractable Assessment & Plan: Qlipita denied.  Continue magnesium otc.  Continue maxalt for acute  treatment.       Meds ordered this encounter  Medications   methocarbamol (ROBAXIN) 500 MG tablet    Sig: Take 1 tablet (500 mg total) by mouth every 6 (six) hours as needed for muscle spasms.    Dispense:  180 tablet    Refill:  1    This prescription was filled on 01/10/2023. Any refills authorized will be placed on file.    Orders Placed This Encounter  Procedures   CBC with Differential/Platelet   Comprehensive metabolic panel   Lipid panel   TSH     Follow-up: Return in about 4 months (around 05/31/2023) for chronic fasting.  Total time spent on today's visit was greater than 30 minutes, including both face-to-face time and nonface-to-face time personally spent on review of chart (labs and imaging), discussing labs and goals, discussing further work-up, treatment options, referrals to specialist if needed, reviewing outside records of pertinent, answering patient's questions, and coordinating care.  I,Katherina A Bramblett,acting as a scribe for Blane Ohara, MD.,have documented all relevant documentation on the behalf of Blane Ohara, MD,as directed by  Blane Ohara, MD while in the presence of Blane Ohara, MD.   An After Visit Summary was printed and given to the patient.  Blane Ohara, MD [facility] [phone]

## 2023-01-29 NOTE — Assessment & Plan Note (Addendum)
The current medical regimen is effective;  continue present plan and medications. Continue vyvanse 50 mg daily.

## 2023-01-30 LAB — LIPID PANEL
Chol/HDL Ratio: 4.3 ratio (ref 0.0–4.4)
Cholesterol, Total: 186 mg/dL (ref 100–199)
HDL: 43 mg/dL (ref >39)
LDL Chol Calc (NIH): 98 mg/dL (ref 0–99)
Triglycerides: 264 mg/dL — ABNORMAL HIGH (ref 0–149)
VLDL Cholesterol Cal: 45 mg/dL — ABNORMAL HIGH (ref 5–40)

## 2023-01-30 LAB — CBC WITH DIFFERENTIAL/PLATELET
Basophils Absolute: 0.1 10*3/uL (ref 0.0–0.2)
Basos: 1 %
EOS (ABSOLUTE): 0.3 10*3/uL (ref 0.0–0.4)
Eos: 3 %
Hematocrit: 39.4 % (ref 34.0–46.6)
Hemoglobin: 12.7 g/dL (ref 11.1–15.9)
Immature Grans (Abs): 0 10*3/uL (ref 0.0–0.1)
Immature Granulocytes: 0 %
Lymphocytes Absolute: 2.2 10*3/uL (ref 0.7–3.1)
Lymphs: 25 %
MCH: 27.9 pg (ref 26.6–33.0)
MCHC: 32.2 g/dL (ref 31.5–35.7)
MCV: 86 fL (ref 79–97)
Monocytes Absolute: 0.5 10*3/uL (ref 0.1–0.9)
Monocytes: 6 %
Neutrophils Absolute: 6 10*3/uL (ref 1.4–7.0)
Neutrophils: 65 %
Platelets: 333 10*3/uL (ref 150–450)
RBC: 4.56 x10E6/uL (ref 3.77–5.28)
RDW: 12.5 % (ref 11.7–15.4)
WBC: 9.1 10*3/uL (ref 3.4–10.8)

## 2023-01-30 LAB — COMPREHENSIVE METABOLIC PANEL
ALT: 15 IU/L (ref 0–32)
AST: 15 IU/L (ref 0–40)
Albumin: 4.8 g/dL (ref 3.9–4.9)
Alkaline Phosphatase: 81 IU/L (ref 44–121)
BUN/Creatinine Ratio: 14 (ref 9–23)
BUN: 14 mg/dL (ref 6–24)
Bilirubin Total: <0.2 mg/dL (ref 0.0–1.2)
CO2: 22 mmol/L (ref 20–29)
Calcium: 9.6 mg/dL (ref 8.7–10.2)
Chloride: 103 mmol/L (ref 96–106)
Creatinine, Ser: 0.97 mg/dL (ref 0.57–1.00)
Globulin, Total: 2.1 g/dL (ref 1.5–4.5)
Glucose: 86 mg/dL (ref 70–99)
Potassium: 4.3 mmol/L (ref 3.5–5.2)
Sodium: 139 mmol/L (ref 134–144)
Total Protein: 6.9 g/dL (ref 6.0–8.5)
eGFR: 76 mL/min/{1.73_m2} (ref >59)

## 2023-01-30 LAB — TSH: TSH: 1.1 u[IU]/mL (ref 0.450–4.500)

## 2023-02-06 ENCOUNTER — Encounter: Payer: Self-pay | Admitting: Family Medicine

## 2023-02-06 ENCOUNTER — Other Ambulatory Visit: Payer: Self-pay | Admitting: Family Medicine

## 2023-02-06 DIAGNOSIS — R5383 Other fatigue: Secondary | ICD-10-CM

## 2023-02-06 DIAGNOSIS — G43709 Chronic migraine without aura, not intractable, without status migrainosus: Secondary | ICD-10-CM

## 2023-02-06 DIAGNOSIS — R400 Somnolence: Secondary | ICD-10-CM

## 2023-02-24 ENCOUNTER — Other Ambulatory Visit: Payer: Self-pay | Admitting: Family Medicine

## 2023-02-24 DIAGNOSIS — F900 Attention-deficit hyperactivity disorder, predominantly inattentive type: Secondary | ICD-10-CM

## 2023-02-24 DIAGNOSIS — E66812 Obesity, class 2: Secondary | ICD-10-CM

## 2023-02-26 MED ORDER — LISDEXAMFETAMINE DIMESYLATE 50 MG PO CAPS
50.0000 mg | ORAL_CAPSULE | Freq: Every day | ORAL | 0 refills | Status: DC
Start: 2023-02-26 — End: 2023-03-22

## 2023-02-26 MED ORDER — BUPROPION HCL ER (XL) 150 MG PO TB24
150.0000 mg | ORAL_TABLET | Freq: Every day | ORAL | 0 refills | Status: DC
Start: 2023-02-26 — End: 2023-04-23

## 2023-03-06 ENCOUNTER — Other Ambulatory Visit: Payer: Self-pay

## 2023-03-06 DIAGNOSIS — R400 Somnolence: Secondary | ICD-10-CM

## 2023-03-06 DIAGNOSIS — R5383 Other fatigue: Secondary | ICD-10-CM

## 2023-03-06 DIAGNOSIS — G43709 Chronic migraine without aura, not intractable, without status migrainosus: Secondary | ICD-10-CM

## 2023-03-15 ENCOUNTER — Other Ambulatory Visit: Payer: Self-pay | Admitting: Family Medicine

## 2023-03-15 DIAGNOSIS — E66812 Morbid (severe) obesity due to excess calories: Secondary | ICD-10-CM

## 2023-03-22 ENCOUNTER — Encounter: Payer: Self-pay | Admitting: Family Medicine

## 2023-03-22 ENCOUNTER — Other Ambulatory Visit: Payer: Self-pay

## 2023-03-22 DIAGNOSIS — M545 Low back pain, unspecified: Secondary | ICD-10-CM

## 2023-03-22 DIAGNOSIS — F900 Attention-deficit hyperactivity disorder, predominantly inattentive type: Secondary | ICD-10-CM

## 2023-03-22 MED ORDER — METHOCARBAMOL 500 MG PO TABS
500.0000 mg | ORAL_TABLET | Freq: Four times a day (QID) | ORAL | 1 refills | Status: DC | PRN
Start: 2023-03-22 — End: 2023-05-24

## 2023-03-22 MED ORDER — LISDEXAMFETAMINE DIMESYLATE 50 MG PO CAPS
50.0000 mg | ORAL_CAPSULE | Freq: Every day | ORAL | 0 refills | Status: DC
Start: 2023-03-22 — End: 2023-04-23

## 2023-03-29 ENCOUNTER — Other Ambulatory Visit: Payer: Self-pay | Admitting: Family Medicine

## 2023-03-29 DIAGNOSIS — G43709 Chronic migraine without aura, not intractable, without status migrainosus: Secondary | ICD-10-CM

## 2023-03-29 MED ORDER — RIZATRIPTAN BENZOATE 10 MG PO TABS
10.0000 mg | ORAL_TABLET | ORAL | 2 refills | Status: DC | PRN
Start: 2023-03-29 — End: 2023-10-15

## 2023-04-23 ENCOUNTER — Encounter: Payer: Self-pay | Admitting: Family Medicine

## 2023-04-23 ENCOUNTER — Other Ambulatory Visit: Payer: Self-pay

## 2023-04-23 DIAGNOSIS — F900 Attention-deficit hyperactivity disorder, predominantly inattentive type: Secondary | ICD-10-CM

## 2023-04-23 MED ORDER — BUPROPION HCL ER (XL) 150 MG PO TB24: 150.0000 mg | ORAL_TABLET | Freq: Every day | ORAL | 0 refills | Status: DC

## 2023-04-23 MED ORDER — BUPROPION HCL ER (XL) 300 MG PO TB24: 300.0000 mg | ORAL_TABLET | Freq: Every day | ORAL | 1 refills | Status: DC

## 2023-04-23 MED ORDER — LISDEXAMFETAMINE DIMESYLATE 50 MG PO CAPS
50.0000 mg | ORAL_CAPSULE | Freq: Every day | ORAL | 0 refills | Status: DC
Start: 2023-04-23 — End: 2023-05-24

## 2023-04-23 NOTE — Addendum Note (Signed)
Addended by: Gwynneth Aliment L on: 04/23/2023 09:09 AM   Modules accepted: Orders

## 2023-05-23 NOTE — Progress Notes (Signed)
Cancelled. Dr. Cox  

## 2023-05-24 ENCOUNTER — Telehealth: Payer: Self-pay

## 2023-05-24 ENCOUNTER — Other Ambulatory Visit: Payer: Self-pay

## 2023-05-24 DIAGNOSIS — M545 Low back pain, unspecified: Secondary | ICD-10-CM

## 2023-05-24 DIAGNOSIS — F900 Attention-deficit hyperactivity disorder, predominantly inattentive type: Secondary | ICD-10-CM

## 2023-05-24 NOTE — Telephone Encounter (Signed)
Prescription Request  05/24/2023 Patient has an appointment with Huston Foley on 12/26. She will be out before then.  What is the name of the medication or equipment?  lisdexamfetamine (VYVANSE) 50 MG capsule methocarbamol (ROBAXIN) 500 MG tablet  Have you contacted your pharmacy to request a refill? No   Which pharmacy would you like this sent to?   [facility] [address] [place], [place] - [address] [place] [address] Phone: [phone] Fax: [phone]    Patient notified that their request is being sent to the clinical staff for review and that they should receive a response within 2 business days.   Please advise at [facility] [phone]

## 2023-05-25 ENCOUNTER — Encounter: Payer: BC Managed Care – PPO | Admitting: Family Medicine

## 2023-05-25 DIAGNOSIS — F900 Attention-deficit hyperactivity disorder, predominantly inattentive type: Secondary | ICD-10-CM

## 2023-05-25 DIAGNOSIS — E782 Mixed hyperlipidemia: Secondary | ICD-10-CM

## 2023-05-25 DIAGNOSIS — G43709 Chronic migraine without aura, not intractable, without status migrainosus: Secondary | ICD-10-CM

## 2023-05-25 MED ORDER — LISDEXAMFETAMINE DIMESYLATE 50 MG PO CAPS
50.0000 mg | ORAL_CAPSULE | Freq: Every day | ORAL | 0 refills | Status: DC
Start: 2023-05-25 — End: 2023-06-25

## 2023-05-25 MED ORDER — METHOCARBAMOL 500 MG PO TABS: 500.0000 mg | ORAL_TABLET | Freq: Four times a day (QID) | ORAL | 1 refills | Status: DC | PRN

## 2023-05-31 ENCOUNTER — Ambulatory Visit: Payer: BC Managed Care – PPO | Admitting: Physician Assistant

## 2023-06-03 NOTE — Progress Notes (Unsigned)
Subjective:  Patient ID: Lauren Faulkner, female    DOB: 06/23/1980  Age: 42 y.o. MRN: [id number]  Chief Complaint  Patient presents with   Medical Management of Chronic Issues    HPI   ADHD, follow-up: Pt has a history of ADHD, inattentive type for several years. Current treatment includes Vyvanse 50 mg QD. States symptoms are currently well-controlled. Denies side effects of treatment.    Depression Wellbutrin 150 mg daily. Tapering off. Doing well.    Chronic migraine Maxalt 10 mg as needed. Magnesium has helped. Migraines 3-4 per month.   Mixed Hyperlipidemia: on no medicines. Eating healthy. Trying to exercise. Patient is seeing pain clinic for nerve ablation for low back pain.   OSA: CPAP is helping.   Nerve ablation lumbar back pain. Helped.      06/04/2023   10:01 AM 01/29/2023    2:32 PM 09/29/2022   10:33 AM 05/23/2022    9:58 AM 02/20/2022    7:52 AM  Depression screen PHQ 2/9  Decreased Interest 2 2 1 2 1   Down, Depressed, Hopeless 0 1 1 1 1   PHQ - 2 Score 2 3 2 3 2   Altered sleeping 0 1 0 0 0  Tired, decreased energy 3 3 1 3 3   Change in appetite 3 3 0 2 1  Feeling bad or failure about yourself  0 0 0 0 1  Trouble concentrating 1 2 1 3 1   Moving slowly or fidgety/restless 0 0 0 3 1  Suicidal thoughts 0 0 0 0 0  PHQ-9 Score 9 12 4 14 9   Difficult doing work/chores Somewhat difficult Somewhat difficult Not difficult at all Somewhat difficult Somewhat difficult        06/04/2023   10:01 AM  Fall Risk   Falls in the past year? 0  Number falls in past yr: 0  Injury with Fall? 0  Risk for fall due to : No Fall Risks  Follow up Falls evaluation completed    Patient Care Team: Blane Ohara, MD as PCP - General (Family Medicine) Janalyn Harder, MD (Inactive) as Consulting Physician (Dermatology)   Review of Systems  Constitutional:  Negative for chills, fatigue and fever.  HENT:  Negative for congestion, ear pain and sore throat.   Respiratory:   Negative for cough and shortness of breath.   Cardiovascular:  Negative for chest pain.  Gastrointestinal:  Negative for abdominal pain, constipation, diarrhea, nausea and vomiting.  Genitourinary:  Negative for dysuria and urgency.  Musculoskeletal:  Negative for arthralgias and myalgias.  Skin:  Negative for rash.  Neurological:  Negative for dizziness and headaches.  Psychiatric/Behavioral:  Negative for dysphoric mood. The patient is not nervous/anxious.     Current Outpatient Medications on File Prior to Visit  Medication Sig Dispense Refill   buPROPion (WELLBUTRIN XL) 150 MG 24 hr tablet Take 1 tablet (150 mg total) by mouth daily. 90 tablet 0   cholecalciferol (VITAMIN D) 1000 units tablet Take 1,000 Units by mouth daily.     lidocaine (LIDODERM) 5 % Place 1 patch onto the skin daily. Remove & Discard patch within 12 hours or as directed by MD 30 patch 2   lisdexamfetamine (VYVANSE) 50 MG capsule Take 1 capsule (50 mg total) by mouth daily. 30 capsule 0   methocarbamol (ROBAXIN) 500 MG tablet Take 1 tablet (500 mg total) by mouth every 6 (six) hours as needed for muscle spasms. 180 tablet 1   Multiple Vitamin (MULTIVITAMIN) tablet Take 1  tablet by mouth daily.     ondansetron (ZOFRAN-ODT) 8 MG disintegrating tablet DISSOLVE ONE TABLET BY MOUTH EVERY 6 HOURS AS NEEDED 30 tablet 1   rizatriptan (MAXALT) 10 MG tablet Take 1 tablet (10 mg total) by mouth as needed for migraine. May repeat in 2 hours if needed 10 tablet 2   No current facility-administered medications on file prior to visit.   Past Medical History:  Diagnosis Date   Adenomyosis    fibroadenomoa R breast   Hypothyroid    Insomnia    Interstitial cystitis    Migraines    Vitamin D deficiency    Past Surgical History:  Procedure Laterality Date   CERVICAL CONE BIOPSY      Family History  Problem Relation Age of Onset   Diabetes Mother    Hyperlipidemia Mother    Fibroids Mother    Diabetes Father     Hypertension Father    Hyperlipidemia Father    Ovarian cancer Maternal Grandmother    Stroke Maternal Grandmother    Social History   Socioeconomic History   Marital status: Married    Spouse name: Not on file   Number of children: Not on file   Years of education: Not on file   Highest education level: Not on file  Occupational History   Not on file  Tobacco Use   Smoking status: Former    Current packs/day: 0.00    Types: Cigarettes    Quit date: 06/05/2002    Years since quitting: 21.0   Smokeless tobacco: Never  Substance and Sexual Activity   Alcohol use: Yes    Comment: occ   Drug use: No   Sexual activity: Not on file  Other Topics Concern   Not on file  Social History Narrative   Pt is nurse, 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   Caffeine  1 cup per day.     Social Drivers of Corporate investment banker Strain: Low Risk  (08/10/2021)   Overall Financial Resource Strain (CARDIA)    Difficulty of Paying Living Expenses: Not hard at all  Food Insecurity: No Food Insecurity (08/10/2021)   Hunger Vital Sign    Worried About Running Out of Food in the Last Year: Never true    Ran Out of Food in the Last Year: Never true  Transportation Needs: No Transportation Needs (08/10/2021)   PRAPARE - Administrator, Civil Service (Medical): No    Lack of Transportation (Non-Medical): No  Physical Activity: Insufficiently Active (08/10/2021)   Exercise Vital Sign    Days of Exercise per Week: 3 days    Minutes of Exercise per Session: 30 min  Stress: No Stress Concern Present (06/04/2023)   [employer] of Occupational Health - Occupational Stress Questionnaire    Feeling of Stress : Not at all  Social Connections: Moderately Isolated (08/10/2021)   Social Connection and Isolation Panel [NHANES]    Frequency of Communication with Friends and Family: Three times a week    Frequency of Social Gatherings with Friends and Family: Three times a  week    Attends Religious Services: Never    Active Member of Clubs or Organizations: No    Attends Banker Meetings: Never    Marital Status: Married    Objective:  BP 126/78   Pulse 97   Temp (!) 97.1 F (36.2 C)   Ht 5\' 6"  (1.676 m)   Wt 242  lb (109.8 kg)   LMP 05/20/2023   SpO2 98%   BMI 39.06 kg/m      06/04/2023    9:59 AM 01/29/2023    2:30 PM 09/29/2022   10:30 AM  BP/Weight  Systolic BP 126 124 110  Diastolic BP 78 84 74  Wt. (Lbs) 242 227 223  BMI 39.06 kg/m2 36.64 kg/m2 35.99 kg/m2    Physical Exam Vitals reviewed.  Constitutional:      Appearance: Normal appearance. She is obese.  Neck:     Vascular: No carotid bruit.  Cardiovascular:     Rate and Rhythm: Normal rate and regular rhythm.     Heart sounds: Normal heart sounds.  Pulmonary:     Effort: Pulmonary effort is normal. No respiratory distress.     Breath sounds: Normal breath sounds.  Abdominal:     General: Abdomen is flat.     Palpations: Abdomen is soft.     Tenderness: There is no abdominal tenderness.  Neurological:     Mental Status: She is alert and oriented to person, place, and time.  Psychiatric:        Mood and Affect: Mood normal.        Behavior: Behavior normal.     Diabetic Foot Exam - Simple   No data filed      Lab Results  Component Value Date   WBC 9.1 01/29/2023   HGB 12.7 01/29/2023   HCT 39.4 01/29/2023   PLT 333 01/29/2023   GLUCOSE 86 01/29/2023   CHOL 186 01/29/2023   TRIG 264 (H) 01/29/2023   HDL 43 01/29/2023   LDLCALC 98 01/29/2023   ALT 15 01/29/2023   AST 15 01/29/2023   NA 139 01/29/2023   K 4.3 01/29/2023   CL 103 01/29/2023   CREATININE 0.97 01/29/2023   BUN 14 01/29/2023   CO2 22 01/29/2023   TSH 1.100 01/29/2023   HGBA1C 5.5 08/10/2021      Assessment & Plan:    OSA (obstructive sleep apnea) Assessment & Plan: COMPLIANT AND TOLERATING. BENEFITING FROM CPAP.    Attention deficit hyperactivity disorder (ADHD),  predominantly inattentive type Assessment & Plan: The current medical regimen is effective;  continue present plan and medications. Continue vyvanse 50 mg daily.    Mixed hyperlipidemia Assessment & Plan: Await labs/testing for assessment and recommendations. Continue to work on eating a healthy diet and exercise.  Labs drawn today.    Orders: -     CBC with Differential/Platelet -     Comprehensive metabolic panel -     Lipid panel  Mild recurrent major depression (HCC) Assessment & Plan: Weaning off wellbutrin. Call if not doing well.    Chronic migraine without aura without status migrainosus, not intractable Assessment & Plan: Doing well.  Continue magnesium otc.  Continue maxalt for acute treatment.    Class 2 severe obesity due to excess calories with serious comorbidity and body mass index (BMI) of 39.0 to 39.9 in adult [facility]) Assessment & Plan: Recommend continue to work on eating healthy diet and exercise. Comorobidities: OSA AND HYPERLIPIDEMIA.      No orders of the defined types were placed in this encounter.   Orders Placed This Encounter  Procedures   CBC with Differential/Platelet   Comprehensive metabolic panel   Lipid panel     Follow-up: Return in about 3 months (around 09/02/2023) for chronic fasting.   Clayborn Bigness I Leal-Borjas,acting as a scribe for Blane Ohara, MD.,have documented all relevant documentation  on the behalf of Blane Ohara, MD,as directed by  Blane Ohara, MD while in the presence of Blane Ohara, MD.   An After Visit Summary was printed and given to the patient.  Blane Ohara, MD [facility] [phone]

## 2023-06-04 ENCOUNTER — Ambulatory Visit: Payer: Commercial Managed Care - PPO | Admitting: Family Medicine

## 2023-06-04 ENCOUNTER — Encounter: Payer: Self-pay | Admitting: Family Medicine

## 2023-06-04 VITALS — BP 126/78 | HR 97 | Temp 97.1°F | Ht 66.0 in | Wt 242.0 lb

## 2023-06-04 DIAGNOSIS — Z6839 Body mass index (BMI) 39.0-39.9, adult: Secondary | ICD-10-CM

## 2023-06-04 DIAGNOSIS — G4733 Obstructive sleep apnea (adult) (pediatric): Secondary | ICD-10-CM | POA: Diagnosis not present

## 2023-06-04 DIAGNOSIS — F900 Attention-deficit hyperactivity disorder, predominantly inattentive type: Secondary | ICD-10-CM | POA: Diagnosis not present

## 2023-06-04 DIAGNOSIS — F330 Major depressive disorder, recurrent, mild: Secondary | ICD-10-CM

## 2023-06-04 DIAGNOSIS — E782 Mixed hyperlipidemia: Secondary | ICD-10-CM | POA: Diagnosis not present

## 2023-06-04 DIAGNOSIS — E66812 Obesity, class 2: Secondary | ICD-10-CM

## 2023-06-04 DIAGNOSIS — G43709 Chronic migraine without aura, not intractable, without status migrainosus: Secondary | ICD-10-CM

## 2023-06-04 NOTE — Assessment & Plan Note (Signed)
Await labs/testing for assessment and recommendations. Continue to work on eating a healthy diet and exercise.  Labs drawn today.   

## 2023-06-04 NOTE — Assessment & Plan Note (Signed)
Weaning off wellbutrin. Call if not doing well.

## 2023-06-04 NOTE — Assessment & Plan Note (Signed)
The current medical regimen is effective;  continue present plan and medications. Continue vyvanse 50 mg daily.

## 2023-06-04 NOTE — Assessment & Plan Note (Signed)
Recommend continue to work on eating healthy diet and exercise. Comorobidities: OSA AND HYPERLIPIDEMIA.

## 2023-06-04 NOTE — Assessment & Plan Note (Signed)
Doing well.  Continue magnesium otc.  Continue maxalt for acute treatment.

## 2023-06-04 NOTE — Assessment & Plan Note (Signed)
COMPLIANT AND TOLERATING. BENEFITING FROM CPAP.

## 2023-06-04 NOTE — Patient Instructions (Signed)
Weaning off wellbutrin. Call if not doing well.   Recommend continue to work on eating healthy diet and exercise.  Continue to use cpap whenever you are sleeping.

## 2023-06-05 LAB — CBC WITH DIFFERENTIAL/PLATELET
Basophils Absolute: 0.1 10*3/uL (ref 0.0–0.2)
Basos: 1 %
EOS (ABSOLUTE): 0.3 10*3/uL (ref 0.0–0.4)
Eos: 3 %
Hematocrit: 37.6 % (ref 34.0–46.6)
Hemoglobin: 12.1 g/dL (ref 11.1–15.9)
Immature Grans (Abs): 0 10*3/uL (ref 0.0–0.1)
Immature Granulocytes: 0 %
Lymphocytes Absolute: 1.8 10*3/uL (ref 0.7–3.1)
Lymphs: 19 %
MCH: 27.8 pg (ref 26.6–33.0)
MCHC: 32.2 g/dL (ref 31.5–35.7)
MCV: 86 fL (ref 79–97)
Monocytes Absolute: 0.5 10*3/uL (ref 0.1–0.9)
Monocytes: 5 %
Neutrophils Absolute: 6.4 10*3/uL (ref 1.4–7.0)
Neutrophils: 72 %
Platelets: 335 10*3/uL (ref 150–450)
RBC: 4.36 x10E6/uL (ref 3.77–5.28)
RDW: 12.8 % (ref 11.7–15.4)
WBC: 9.1 10*3/uL (ref 3.4–10.8)

## 2023-06-05 LAB — COMPREHENSIVE METABOLIC PANEL
ALT: 33 [IU]/L — ABNORMAL HIGH (ref 0–32)
AST: 20 [IU]/L (ref 0–40)
Albumin: 4.5 g/dL (ref 3.9–4.9)
Alkaline Phosphatase: 81 [IU]/L (ref 44–121)
BUN/Creatinine Ratio: 29 — ABNORMAL HIGH (ref 9–23)
BUN: 24 mg/dL (ref 6–24)
Bilirubin Total: 0.2 mg/dL (ref 0.0–1.2)
CO2: 22 mmol/L (ref 20–29)
Calcium: 9.3 mg/dL (ref 8.7–10.2)
Chloride: 103 mmol/L (ref 96–106)
Creatinine, Ser: 0.83 mg/dL (ref 0.57–1.00)
Globulin, Total: 2.3 g/dL (ref 1.5–4.5)
Glucose: 93 mg/dL (ref 70–99)
Potassium: 4.3 mmol/L (ref 3.5–5.2)
Sodium: 140 mmol/L (ref 134–144)
Total Protein: 6.8 g/dL (ref 6.0–8.5)
eGFR: 91 mL/min/{1.73_m2} (ref >59)

## 2023-06-05 LAB — LIPID PANEL
Chol/HDL Ratio: 3.2 {ratio} (ref 0.0–4.4)
Cholesterol, Total: 212 mg/dL — ABNORMAL HIGH (ref 100–199)
HDL: 67 mg/dL (ref >39)
LDL Chol Calc (NIH): 121 mg/dL — ABNORMAL HIGH (ref 0–99)
Triglycerides: 140 mg/dL (ref 0–149)
VLDL Cholesterol Cal: 24 mg/dL (ref 5–40)

## 2023-06-25 ENCOUNTER — Other Ambulatory Visit: Payer: Self-pay

## 2023-06-25 DIAGNOSIS — F900 Attention-deficit hyperactivity disorder, predominantly inattentive type: Secondary | ICD-10-CM

## 2023-06-25 MED ORDER — LISDEXAMFETAMINE DIMESYLATE 50 MG PO CAPS: 50.0000 mg | ORAL_CAPSULE | Freq: Every day | ORAL | 0 refills | Status: DC

## 2023-08-09 ENCOUNTER — Other Ambulatory Visit: Payer: Self-pay | Admitting: Family Medicine

## 2023-08-09 ENCOUNTER — Encounter: Payer: Self-pay | Admitting: Family Medicine

## 2023-08-09 DIAGNOSIS — F900 Attention-deficit hyperactivity disorder, predominantly inattentive type: Secondary | ICD-10-CM

## 2023-08-10 ENCOUNTER — Other Ambulatory Visit: Payer: Self-pay

## 2023-08-10 DIAGNOSIS — M545 Low back pain, unspecified: Secondary | ICD-10-CM

## 2023-08-10 DIAGNOSIS — F900 Attention-deficit hyperactivity disorder, predominantly inattentive type: Secondary | ICD-10-CM

## 2023-08-10 MED ORDER — METHOCARBAMOL 500 MG PO TABS: 500.0000 mg | ORAL_TABLET | Freq: Four times a day (QID) | ORAL | 0 refills | Status: DC | PRN

## 2023-08-10 MED ORDER — LISDEXAMFETAMINE DIMESYLATE 50 MG PO CAPS: 50.0000 mg | ORAL_CAPSULE | Freq: Every day | ORAL | 0 refills | Status: DC

## 2023-08-15 ENCOUNTER — Other Ambulatory Visit: Payer: Self-pay

## 2023-08-15 DIAGNOSIS — F900 Attention-deficit hyperactivity disorder, predominantly inattentive type: Secondary | ICD-10-CM

## 2023-08-15 MED ORDER — LISDEXAMFETAMINE DIMESYLATE 50 MG PO CAPS: 50.0000 mg | ORAL_CAPSULE | Freq: Every day | ORAL | 0 refills | Status: DC

## 2023-08-21 ENCOUNTER — Telehealth: Admitting: Family Medicine

## 2023-08-21 DIAGNOSIS — N3000 Acute cystitis without hematuria: Secondary | ICD-10-CM | POA: Insufficient documentation

## 2023-08-21 MED ORDER — PHENAZOPYRIDINE HCL 200 MG PO TABS: 200.0000 mg | ORAL_TABLET | Freq: Three times a day (TID) | ORAL | 0 refills | Status: DC | PRN

## 2023-08-21 MED ORDER — FLUCONAZOLE 150 MG PO TABS: 150.0000 mg | ORAL_TABLET | Freq: Once | ORAL | 0 refills | Status: AC

## 2023-08-21 MED ORDER — NITROFURANTOIN MONOHYD MACRO 100 MG PO CAPS: 100.0000 mg | ORAL_CAPSULE | Freq: Two times a day (BID) | ORAL | 0 refills | Status: DC

## 2023-08-21 NOTE — Progress Notes (Addendum)
 Virtual Visit via Video Note   This visit type was conducted due to patient preference. This format is felt to be most appropriate for this patient at this time.  All issues noted in this document were discussed and addressed.  A limited physical exam was performed with this format.  A verbal consent was obtained for the virtual visit.   Patient Location:home Provider Location:office Evaluation Performed:  acute .    Patient ID: Lauren Faulkner, female    DOB: 09/24/1980, 43 y.o.   MRN: [id number]  Chief Complaint  Patient presents with   Dysuria    HPI: Patient is in today for dysuria, abdominal pain, frequency since Saturday. No hematuria. ON Azo helps some, but comes back. No nausea/vomiting. NO fever.   Past Medical History:  Diagnosis Date   Adenomyosis    fibroadenomoa R breast   Hypothyroid    Insomnia    Interstitial cystitis    Migraines    Vitamin D deficiency     Past Surgical History:  Procedure Laterality Date   CERVICAL CONE BIOPSY      Family History  Problem Relation Age of Onset   Diabetes Mother    Hyperlipidemia Mother    Fibroids Mother    Diabetes Father    Hypertension Father    Hyperlipidemia Father    Ovarian cancer Maternal Grandmother    Stroke Maternal Grandmother     Social History   Socioeconomic History   Marital status: Married    Spouse name: Not on file   Number of children: Not on file   Years of education: Not on file   Highest education level: Bachelor's degree (e.g., BA, AB, BS)  Occupational History   Not on file  Tobacco Use   Smoking status: Former    Current packs/day: 0.00    Types: Cigarettes    Quit date: 06/05/2002    Years since quitting: 21.2   Smokeless tobacco: Never  Substance and Sexual Activity   Alcohol use: Yes    Comment: occ   Drug use: No   Sexual activity: Not on file  Other Topics Concern   Not on file  Social History Narrative   Pt is nurse, 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   Caffeine  1 cup per day.     Social Drivers of Corporate investment banker Strain: Low Risk  (08/21/2023)   Overall Financial Resource Strain (CARDIA)    Difficulty of Paying Living Expenses: Not hard at all  Food Insecurity: No Food Insecurity (08/21/2023)   Hunger Vital Sign    Worried About Running Out of Food in the Last Year: Never true    Ran Out of Food in the Last Year: Never true  Transportation Needs: No Transportation Needs (08/21/2023)   PRAPARE - Administrator, Civil Service (Medical): No    Lack of Transportation (Non-Medical): No  Physical Activity: Insufficiently Active (08/21/2023)   Exercise Vital Sign    Days of Exercise per Week: 2 days    Minutes of Exercise per Session: 30 min  Stress: No Stress Concern Present (08/21/2023)   [employer] of Occupational Health - Occupational Stress Questionnaire    Feeling of Stress : Not at all  Social Connections: Moderately Isolated (08/21/2023)   Social Connection and Isolation Panel [NHANES]    Frequency of Communication with Friends and Family: Twice a week    Frequency of Social Gatherings with Friends and Family:  Once a week    Attends Religious Services: Never    Active Member of Clubs or Organizations: No    Attends Banker Meetings: Not on file    Marital Status: Living with partner  Intimate Partner Violence: Not At Risk (08/10/2021)   Humiliation, Afraid, Rape, and Kick questionnaire    Fear of Current or Ex-Partner: No    Emotionally Abused: No    Physically Abused: No    Sexually Abused: No    Outpatient Medications Prior to Visit  Medication Sig Dispense Refill   buPROPion (WELLBUTRIN XL) 150 MG 24 hr tablet Take 1 tablet (150 mg total) by mouth daily. 90 tablet 0   cholecalciferol (VITAMIN D) 1000 units tablet Take 1,000 Units by mouth daily.     lidocaine (LIDODERM) 5 % Place 1 patch onto the skin daily. Remove & Discard patch within 12 hours or as directed by  MD 30 patch 2   lisdexamfetamine (VYVANSE) 50 MG capsule Take 1 capsule (50 mg total) by mouth daily. F90.0 30 capsule 0   methocarbamol (ROBAXIN) 500 MG tablet Take 1 tablet (500 mg total) by mouth every 6 (six) hours as needed for muscle spasms. 180 tablet 0   Multiple Vitamin (MULTIVITAMIN) tablet Take 1 tablet by mouth daily.     ondansetron (ZOFRAN-ODT) 8 MG disintegrating tablet DISSOLVE ONE TABLET BY MOUTH EVERY 6 HOURS AS NEEDED 30 tablet 1   rizatriptan (MAXALT) 10 MG tablet Take 1 tablet (10 mg total) by mouth as needed for migraine. May repeat in 2 hours if needed 10 tablet 2   No facility-administered medications prior to visit.    No Known Allergies  Review of Systems  All other systems reviewed and are negative.      Objective:        06/04/2023    9:59 AM 01/29/2023    2:30 PM 09/29/2022   10:30 AM  Vitals with BMI  Height 5\' 6"  5\' 6"  5\' 6"   Weight 242 lbs 227 lbs 223 lbs  BMI 39.08 36.66 36.01  Systolic 126 124 366  Diastolic 78 84 74  Pulse 97 87 84    No data found.   Physical Exam Constitutional:      Appearance: Normal appearance.  Neurological:     Mental Status: She is alert.     There are no preventive care reminders to display for this patient.  There are no preventive care reminders to display for this patient.   Lab Results  Component Value Date   TSH 1.100 01/29/2023   Lab Results  Component Value Date   WBC 9.1 06/04/2023   HGB 12.1 06/04/2023   HCT 37.6 06/04/2023   MCV 86 06/04/2023   PLT 335 06/04/2023   Lab Results  Component Value Date   NA 140 06/04/2023   K 4.3 06/04/2023   CO2 22 06/04/2023   GLUCOSE 93 06/04/2023   BUN 24 06/04/2023   CREATININE 0.83 06/04/2023   BILITOT 0.2 06/04/2023   ALKPHOS 81 06/04/2023   AST 20 06/04/2023   ALT 33 (H) 06/04/2023   PROT 6.8 06/04/2023   ALBUMIN 4.5 06/04/2023   CALCIUM 9.3 06/04/2023   EGFR 91 06/04/2023   Lab Results  Component Value Date   CHOL 212 (H)  06/04/2023   Lab Results  Component Value Date   HDL 67 06/04/2023   Lab Results  Component Value Date   LDLCALC 121 (H) 06/04/2023   Lab Results  Component Value  Date   TRIG 140 06/04/2023   Lab Results  Component Value Date   CHOLHDL 3.2 06/04/2023   Lab Results  Component Value Date   HGBA1C 5.5 08/10/2021       Assessment & Plan:  Acute cystitis without hematuria Assessment & Plan: Treatment with Macrobid 100 g twice daily for 5 days. Given Pyridium take the place of the Azo. Started on fluconazole if develops yeast infection. Recommended patient come to the office on Thursday during her open hours that she is having persistent symptoms we will check a urinalysis.  She will try to hold the Pyridium to 12 hours prior to coming however if she is unable to tolerate it still in her urine we will do a microscopic urinalysis.  I would have the patient wait while we we check a urinalysis.  Certainly for the symptoms significantly worsen in the next 24 hours she may call and we will get her an appointment sooner.  Orders: -     Nitrofurantoin Monohyd Macro; Take 1 capsule (100 mg total) by mouth 2 (two) times daily.  Dispense: 10 capsule; Refill: 0 -     Fluconazole; Take 1 tablet (150 mg total) by mouth once for 1 dose.  Dispense: 1 tablet; Refill: 0 -     Phenazopyridine HCl; Take 1 tablet (200 mg total) by mouth 3 (three) times daily as needed for pain.  Dispense: 10 tablet; Refill: 0     Follow-up: Return in about 2 days (around 08/23/2023).  An After Visit Summary was printed and given to the patient.  Blane Ohara, MD [facility] [phone]

## 2023-08-21 NOTE — Patient Instructions (Signed)
 Treatment with Macrobid 100 g twice daily for 5 days. Given Pyridium take the place of the Azo. Started on fluconazole if develops yeast infection. Recommended patient come to the office on Thursday during her open hours that she is having persistent symptoms we will check a urinalysis.  She will try to hold the Pyridium to 12 hours prior to coming however if she is unable to tolerate it still in her urine we will do a microscopic urinalysis.  I would have the patient wait while we we check a urinalysis.  Certainly for the symptoms significantly worsen in the next 24 hours she may call and we will get her an appointment sooner.

## 2023-08-21 NOTE — Assessment & Plan Note (Signed)
 Treatment with Macrobid 100 g twice daily for 5 days. Given Pyridium take the place of the Azo. Started on fluconazole if develops yeast infection. Recommended patient come to the office on Thursday during her open hours that she is having persistent symptoms we will check a urinalysis.  She will try to hold the Pyridium to 12 hours prior to coming however if she is unable to tolerate it still in her urine we will do a microscopic urinalysis.  I would have the patient wait while we we check a urinalysis.  Certainly for the symptoms significantly worsen in the next 24 hours she may call and we will get her an appointment sooner.

## 2023-09-11 NOTE — Progress Notes (Deleted)
 Subjective:  Patient ID: Lauren Faulkner, female    DOB: March 03, 1981  Age: 43 y.o. MRN: [id number]  Chief Complaint  Patient presents with   Medical Management of Chronic Issues    Discussed the use of AI scribe software for clinical note transcription with the patient, who gave verbal consent to proceed.   ADHD, follow-up: Pt has a history of ADHD, inattentive type for several years. Current treatment includes Vyvanse 40 mg QD. States symptoms are currently well-controlled. Denies side effects of treatment. She is adherent to follow-up appointments and medication regimen.   Depression Wellbutrin 150 mg daily, Wellbutrin 300 mg daily.    Chronic migrane Maxalt 10 mg as needed.  Quilpta 60 mg daily.  Mixed Hyperlipidemia: on no medicines.        06/04/2023   10:01 AM 01/29/2023    2:32 PM 09/29/2022   10:33 AM 05/23/2022    9:58 AM 02/20/2022    7:52 AM  Depression screen PHQ 2/9  Decreased Interest 2 2 1 2 1   Down, Depressed, Hopeless 0 1 1 1 1   PHQ - 2 Score 2 3 2 3 2   Altered sleeping 0 1 0 0 0  Tired, decreased energy 3 3 1 3 3   Change in appetite 3 3 0 2 1  Feeling bad or failure about yourself  0 0 0 0 1  Trouble concentrating 1 2 1 3 1   Moving slowly or fidgety/restless 0 0 0 3 1  Suicidal thoughts 0 0 0 0 0  PHQ-9 Score 9 12 4 14 9   Difficult doing work/chores Somewhat difficult Somewhat difficult Not difficult at all Somewhat difficult Somewhat difficult        06/04/2023   10:01 AM  Fall Risk   Falls in the past year? 0  Number falls in past yr: 0  Injury with Fall? 0  Risk for fall due to : No Fall Risks  Follow up Falls evaluation completed    Patient Care Team: Blane Ohara, MD as PCP - General (Family Medicine) Janalyn Harder, MD (Inactive) as Consulting Physician (Dermatology)   Review of Systems  Current Outpatient Medications on File Prior to Visit  Medication Sig Dispense Refill   buPROPion (WELLBUTRIN XL) 150 MG 24 hr tablet Take 1  tablet (150 mg total) by mouth daily. 90 tablet 0   cholecalciferol (VITAMIN D) 1000 units tablet Take 1,000 Units by mouth daily.     lidocaine (LIDODERM) 5 % Place 1 patch onto the skin daily. Remove & Discard patch within 12 hours or as directed by MD 30 patch 2   lisdexamfetamine (VYVANSE) 50 MG capsule Take 1 capsule (50 mg total) by mouth daily. F90.0 30 capsule 0   methocarbamol (ROBAXIN) 500 MG tablet Take 1 tablet (500 mg total) by mouth every 6 (six) hours as needed for muscle spasms. 180 tablet 0   Multiple Vitamin (MULTIVITAMIN) tablet Take 1 tablet by mouth daily.     nitrofurantoin, macrocrystal-monohydrate, (MACROBID) 100 MG capsule Take 1 capsule (100 mg total) by mouth 2 (two) times daily. 10 capsule 0   ondansetron (ZOFRAN-ODT) 8 MG disintegrating tablet DISSOLVE ONE TABLET BY MOUTH EVERY 6 HOURS AS NEEDED 30 tablet 1   phenazopyridine (PYRIDIUM) 200 MG tablet Take 1 tablet (200 mg total) by mouth 3 (three) times daily as needed for pain. 10 tablet 0   rizatriptan (MAXALT) 10 MG tablet Take 1 tablet (10 mg total) by mouth as needed for migraine. May repeat in 2 hours  if needed 10 tablet 2   No current facility-administered medications on file prior to visit.   Past Medical History:  Diagnosis Date   Adenomyosis    fibroadenomoa R breast   Hypothyroid    Insomnia    Interstitial cystitis    Migraines    Vitamin D deficiency    Past Surgical History:  Procedure Laterality Date   CERVICAL CONE BIOPSY      Family History  Problem Relation Age of Onset   Diabetes Mother    Hyperlipidemia Mother    Fibroids Mother    Diabetes Father    Hypertension Father    Hyperlipidemia Father    Ovarian cancer Maternal Grandmother    Stroke Maternal Grandmother    Social History   Socioeconomic History   Marital status: Married    Spouse name: Not on file   Number of children: Not on file   Years of education: Not on file   Highest education level: Bachelor's degree (e.g.,  BA, AB, BS)  Occupational History   Not on file  Tobacco Use   Smoking status: Former    Current packs/day: 0.00    Types: Cigarettes    Quit date: 06/05/2002    Years since quitting: 21.2   Smokeless tobacco: Never  Substance and Sexual Activity   Alcohol use: Yes    Comment: occ   Drug use: No   Sexual activity: Not on file  Other Topics Concern   Not on file  Social History Narrative   Pt is nurse, 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   Caffeine  1 cup per day.     Social Drivers of Corporate investment banker Strain: Low Risk  (08/21/2023)   Overall Financial Resource Strain (CARDIA)    Difficulty of Paying Living Expenses: Not hard at all  Food Insecurity: No Food Insecurity (08/21/2023)   Hunger Vital Sign    Worried About Running Out of Food in the Last Year: Never true    Ran Out of Food in the Last Year: Never true  Transportation Needs: No Transportation Needs (08/21/2023)   PRAPARE - Administrator, Civil Service (Medical): No    Lack of Transportation (Non-Medical): No  Physical Activity: Insufficiently Active (08/21/2023)   Exercise Vital Sign    Days of Exercise per Week: 2 days    Minutes of Exercise per Session: 30 min  Stress: No Stress Concern Present (08/21/2023)   [employer] of Occupational Health - Occupational Stress Questionnaire    Feeling of Stress : Not at all  Social Connections: Moderately Isolated (08/21/2023)   Social Connection and Isolation Panel [NHANES]    Frequency of Communication with Friends and Family: Twice a week    Frequency of Social Gatherings with Friends and Family: Once a week    Attends Religious Services: Never    Database administrator or Organizations: No    Attends Engineer, structural: Not on file    Marital Status: Living with partner    Objective:  There were no vitals taken for this visit.     06/04/2023    9:59 AM 01/29/2023    2:30 PM 09/29/2022   10:30 AM   BP/Weight  Systolic BP 126 124 110  Diastolic BP 78 84 74  Wt. (Lbs) 242 227 223  BMI 39.06 kg/m2 36.64 kg/m2 35.99 kg/m2    Physical Exam  Diabetic Foot Exam - Simple   No  data filed      Lab Results  Component Value Date   WBC 9.1 06/04/2023   HGB 12.1 06/04/2023   HCT 37.6 06/04/2023   PLT 335 06/04/2023   GLUCOSE 93 06/04/2023   CHOL 212 (H) 06/04/2023   TRIG 140 06/04/2023   HDL 67 06/04/2023   LDLCALC 121 (H) 06/04/2023   ALT 33 (H) 06/04/2023   AST 20 06/04/2023   NA 140 06/04/2023   K 4.3 06/04/2023   CL 103 06/04/2023   CREATININE 0.83 06/04/2023   BUN 24 06/04/2023   CO2 22 06/04/2023   TSH 1.100 01/29/2023   HGBA1C 5.5 08/10/2021      Assessment & Plan:  Assessment and Plan       There are no diagnoses linked to this encounter.   No orders of the defined types were placed in this encounter.   No orders of the defined types were placed in this encounter.    Follow-up: No follow-ups on file.   I,Luci Bellucci I Leal-Borjas,acting as a scribe for Blane Ohara, MD.,have documented all relevant documentation on the behalf of Blane Ohara, MD,as directed by  Blane Ohara, MD while in the presence of Blane Ohara, MD.   An After Visit Summary was printed and given to the patient.  Blane Ohara, MD [facility] [phone]

## 2023-09-12 ENCOUNTER — Ambulatory Visit: Payer: Commercial Managed Care - PPO | Admitting: Family Medicine

## 2023-09-12 DIAGNOSIS — G43709 Chronic migraine without aura, not intractable, without status migrainosus: Secondary | ICD-10-CM

## 2023-09-12 DIAGNOSIS — E782 Mixed hyperlipidemia: Secondary | ICD-10-CM

## 2023-09-12 DIAGNOSIS — F900 Attention-deficit hyperactivity disorder, predominantly inattentive type: Secondary | ICD-10-CM

## 2023-09-12 DIAGNOSIS — G4733 Obstructive sleep apnea (adult) (pediatric): Secondary | ICD-10-CM

## 2023-09-15 NOTE — Progress Notes (Unsigned)
 This encounter was created in error - please disregard.

## 2023-09-18 ENCOUNTER — Other Ambulatory Visit: Payer: Self-pay

## 2023-09-18 ENCOUNTER — Encounter: Payer: Self-pay | Admitting: Family Medicine

## 2023-09-18 DIAGNOSIS — M545 Low back pain, unspecified: Secondary | ICD-10-CM

## 2023-09-18 DIAGNOSIS — F900 Attention-deficit hyperactivity disorder, predominantly inattentive type: Secondary | ICD-10-CM

## 2023-09-19 MED ORDER — LISDEXAMFETAMINE DIMESYLATE 50 MG PO CAPS: 50.0000 mg | ORAL_CAPSULE | Freq: Every day | ORAL | 0 refills | Status: DC

## 2023-09-19 MED ORDER — METHOCARBAMOL 500 MG PO TABS: 500.0000 mg | ORAL_TABLET | Freq: Four times a day (QID) | ORAL | 0 refills | Status: DC | PRN

## 2023-10-15 ENCOUNTER — Other Ambulatory Visit: Payer: Self-pay

## 2023-10-15 DIAGNOSIS — F900 Attention-deficit hyperactivity disorder, predominantly inattentive type: Secondary | ICD-10-CM

## 2023-10-15 DIAGNOSIS — G43709 Chronic migraine without aura, not intractable, without status migrainosus: Secondary | ICD-10-CM

## 2023-10-15 MED ORDER — LISDEXAMFETAMINE DIMESYLATE 50 MG PO CAPS: 50.0000 mg | ORAL_CAPSULE | Freq: Every day | ORAL | 0 refills | Status: DC

## 2023-10-15 MED ORDER — RIZATRIPTAN BENZOATE 10 MG PO TABS: 10.0000 mg | ORAL_TABLET | ORAL | 2 refills | Status: DC | PRN

## 2023-10-18 ENCOUNTER — Other Ambulatory Visit: Payer: Self-pay

## 2023-10-18 DIAGNOSIS — F900 Attention-deficit hyperactivity disorder, predominantly inattentive type: Secondary | ICD-10-CM

## 2023-10-18 DIAGNOSIS — G43709 Chronic migraine without aura, not intractable, without status migrainosus: Secondary | ICD-10-CM

## 2023-10-18 MED ORDER — RIZATRIPTAN BENZOATE 10 MG PO TABS: 10.0000 mg | ORAL_TABLET | ORAL | 2 refills | Status: AC | PRN

## 2023-10-18 MED ORDER — LISDEXAMFETAMINE DIMESYLATE 50 MG PO CAPS: 50.0000 mg | ORAL_CAPSULE | Freq: Every day | ORAL | 0 refills | Status: DC

## 2023-10-18 NOTE — Telephone Encounter (Signed)
 Medication was sent to wrong pharmacy. She would like it sent to [facility] in [place]

## 2023-11-05 ENCOUNTER — Ambulatory Visit: Admitting: Family Medicine

## 2023-11-05 ENCOUNTER — Encounter: Payer: Self-pay | Admitting: Family Medicine

## 2023-11-05 VITALS — BP 140/70 | HR 84 | Temp 97.9°F | Resp 14 | Ht 66.0 in | Wt 243.0 lb

## 2023-11-05 DIAGNOSIS — Z8619 Personal history of other infectious and parasitic diseases: Secondary | ICD-10-CM

## 2023-11-05 DIAGNOSIS — M545 Low back pain, unspecified: Secondary | ICD-10-CM | POA: Diagnosis not present

## 2023-11-05 DIAGNOSIS — G43709 Chronic migraine without aura, not intractable, without status migrainosus: Secondary | ICD-10-CM

## 2023-11-05 DIAGNOSIS — F330 Major depressive disorder, recurrent, mild: Secondary | ICD-10-CM

## 2023-11-05 DIAGNOSIS — F900 Attention-deficit hyperactivity disorder, predominantly inattentive type: Secondary | ICD-10-CM

## 2023-11-05 DIAGNOSIS — G4733 Obstructive sleep apnea (adult) (pediatric): Secondary | ICD-10-CM

## 2023-11-05 DIAGNOSIS — E66812 Obesity, class 2: Secondary | ICD-10-CM | POA: Diagnosis not present

## 2023-11-05 DIAGNOSIS — Z6839 Body mass index (BMI) 39.0-39.9, adult: Secondary | ICD-10-CM

## 2023-11-05 MED ORDER — METHOCARBAMOL 500 MG PO TABS: 500.0000 mg | ORAL_TABLET | Freq: Four times a day (QID) | ORAL | 0 refills | Status: DC | PRN

## 2023-11-05 MED ORDER — METHYLPHENIDATE HCL 20 MG PO TABS: 20.0000 mg | ORAL_TABLET | Freq: Two times a day (BID) | ORAL | 0 refills | Status: DC

## 2023-11-05 MED ORDER — LIDOCAINE 5 % EX PTCH: 1.0000 | MEDICATED_PATCH | CUTANEOUS | 2 refills | Status: AC

## 2023-11-05 MED ORDER — LIDOCAINE 5 % EX PTCH: 1.0000 | MEDICATED_PATCH | CUTANEOUS | 2 refills | Status: DC

## 2023-11-05 NOTE — Progress Notes (Unsigned)
 Subjective:  Patient ID: Lauren Faulkner, female    DOB: [birth year]  Age: 43 y.o. MRN: [id number]  Chief Complaint  Patient presents with   Medical Management of Chronic Issues    HPI:  ADHD, follow-up: Pt has a history of ADHD, inattentive type for several years. Current treatment includes Vyvanse  50 mg QD. States symptoms are fairly well-controlled, but she works night and she is requesting to try something shorter acting so it does not last as long if she is off, but at the same time will last her entire shift if needed.     Depression Doing well.    Chronic migraine Maxalt  10 mg as needed. Magnesium has helped. Migraines 3-4 per month.   Mixed Hyperlipidemia: on no medicines. Eating healthy. Trying to exercise.   Patient is seeing pain clinic for nerve ablation for low back pain.   OSA: CPAP is helping.       11/05/2023    1:50 PM 06/04/2023   10:01 AM 01/29/2023    2:32 PM 09/29/2022   10:33 AM 05/23/2022    9:58 AM  Depression screen PHQ 2/9  Decreased Interest 1 2 2 1 2   Down, Depressed, Hopeless 0 0 1 1 1   PHQ - 2 Score 1 2 3 2 3   Altered sleeping 0 0 1 0 0  Tired, decreased energy 3 3 3 1 3   Change in appetite 2 3 3  0 2  Feeling bad or failure about yourself  0 0 0 0 0  Trouble concentrating 2 1 2 1 3   Moving slowly or fidgety/restless 0 0 0 0 3  Suicidal thoughts 0 0 0 0 0  PHQ-9 Score 8 9 12 4 14   Difficult doing work/chores Somewhat difficult Somewhat difficult Somewhat difficult Not difficult at all Somewhat difficult        11/05/2023    1:50 PM  Fall Risk   Falls in the past year? 0  Number falls in past yr: 0  Injury with Fall? 0  Risk for fall due to : No Fall Risks  Follow up Education provided    Patient Care Team: Mercy Stall, MD as PCP - General (Family Medicine) Devon Fogo, MD (Inactive) as Consulting Physician (Dermatology)   Review of Systems  Constitutional:  Negative for chills, fatigue and fever.  HENT:  Negative for  congestion, ear pain and sore throat.   Respiratory:  Negative for cough and shortness of breath.   Cardiovascular:  Negative for chest pain and palpitations.  Gastrointestinal:  Negative for abdominal pain, constipation, diarrhea, nausea and vomiting.  Endocrine: Negative for polydipsia, polyphagia and polyuria.  Genitourinary:  Negative for difficulty urinating and dysuria.  Musculoskeletal:  Negative for arthralgias, back pain and myalgias.  Skin:  Negative for rash.  Neurological:  Negative for headaches.  Psychiatric/Behavioral:  Negative for dysphoric mood. The patient is not nervous/anxious.     Current Outpatient Medications on File Prior to Visit  Medication Sig Dispense Refill   cholecalciferol (VITAMIN D) 1000 units tablet Take 1,000 Units by mouth daily.     Multiple Vitamin (MULTIVITAMIN) tablet Take 1 tablet by mouth daily.     ondansetron  (ZOFRAN -ODT) 8 MG disintegrating tablet DISSOLVE ONE TABLET BY MOUTH EVERY 6 HOURS AS NEEDED 30 tablet 1   rizatriptan  (MAXALT ) 10 MG tablet Take 1 tablet (10 mg total) by mouth as needed for migraine. May repeat in 2 hours if needed 10 tablet 2   No current facility-administered medications on file prior  to visit.   Past Medical History:  Diagnosis Date   Adenomyosis    fibroadenomoa R breast   Hypothyroid    Insomnia    Interstitial cystitis    Migraines    Vitamin D deficiency    Past Surgical History:  Procedure Laterality Date   CERVICAL CONE BIOPSY      Family History  Problem Relation Age of Onset   Diabetes Mother    Hyperlipidemia Mother    Fibroids Mother    Diabetes Father    Hypertension Father    Hyperlipidemia Father    Ovarian cancer Maternal Grandmother    Stroke Maternal Grandmother    Social History   Socioeconomic History   Marital status: Married    Spouse name: Not on file   Number of children: Not on file   Years of education: Not on file   Highest education level: Bachelor's degree (e.g., BA,  AB, BS)  Occupational History   Not on file  Tobacco Use   Smoking status: Former    Current packs/day: 0.00    Types: Cigarettes    Quit date: 06/05/2002    Years since quitting: 21.4   Smokeless tobacco: Never  Vaping Use   Vaping status: Never Used  Substance and Sexual Activity   Alcohol use: Yes    Comment: occ   Drug use: No   Sexual activity: Yes    Partners: Male    Birth control/protection: I.U.D.  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   Caffeine  1 cup per day.     Social Drivers of Corporate investment banker Strain: Low Risk  (08/21/2023)   Overall Financial Resource Strain (CARDIA)    Difficulty of Paying Living Expenses: Not hard at all  Food Insecurity: No Food Insecurity (08/21/2023)   Hunger Vital Sign    Worried About Running Out of Food in the Last Year: Never true    Ran Out of Food in the Last Year: Never true  Transportation Needs: No Transportation Needs (08/21/2023)   PRAPARE - Administrator, Civil Service (Medical): No    Lack of Transportation (Non-Medical): No  Physical Activity: Insufficiently Active (08/21/2023)   Exercise Vital Sign    Days of Exercise per Week: 2 days    Minutes of Exercise per Session: 30 min  Stress: No Stress Concern Present (08/21/2023)   [employer] of Occupational Health - Occupational Stress Questionnaire    Feeling of Stress : Not at all  Social Connections: Moderately Isolated (08/21/2023)   Social Connection and Isolation Panel [NHANES]    Frequency of Communication with Friends and Family: Twice a week    Frequency of Social Gatherings with Friends and Family: Once a week    Attends Religious Services: Never    Database administrator or Organizations: No    Attends Engineer, structural: Not on file    Marital Status: Living with partner    Objective:  BP (!) 140/70   Pulse 84   Temp 97.9  F (36.6 C)   Resp 14   Ht 5\' 6"  (1.676 m)   Wt 243 lb (110.2 kg)   SpO2 97%   BMI 39.22 kg/m      11/05/2023    1:38 PM 06/04/2023    9:59 AM 01/29/2023    2:30 PM  BP/Weight  Systolic BP 140 126 124  Diastolic BP 70 78 84  Wt. (Lbs) 243 242 227  BMI 39.22 kg/m2 39.06 kg/m2 36.64 kg/m2    Physical Exam Vitals reviewed.  Constitutional:      Appearance: Normal appearance. She is normal weight.  Neck:     Vascular: No carotid bruit.  Cardiovascular:     Rate and Rhythm: Normal rate and regular rhythm.     Heart sounds: Normal heart sounds.  Pulmonary:     Effort: Pulmonary effort is normal. No respiratory distress.     Breath sounds: Normal breath sounds.  Abdominal:     General: Bowel sounds are normal.     Palpations: Abdomen is soft.     Tenderness: There is no abdominal tenderness.  Neurological:     Mental Status: She is alert and oriented to person, place, and time.  Psychiatric:        Mood and Affect: Mood normal.        Behavior: Behavior normal.     Diabetic Foot Exam - Simple   No data filed      Lab Results  Component Value Date   WBC 9.1 06/04/2023   HGB 12.1 06/04/2023   HCT 37.6 06/04/2023   PLT 335 06/04/2023   GLUCOSE 93 06/04/2023   CHOL 212 (H) 06/04/2023   TRIG 140 06/04/2023   HDL 67 06/04/2023   LDLCALC 121 (H) 06/04/2023   ALT 33 (H) 06/04/2023   AST 20 06/04/2023   NA 140 06/04/2023   K 4.3 06/04/2023   CL 103 06/04/2023   CREATININE 0.83 06/04/2023   BUN 24 06/04/2023   CO2 22 06/04/2023   TSH 1.100 01/29/2023   HGBA1C 5.5 08/10/2021      Assessment & Plan:  Class 2 severe obesity due to excess calories with serious comorbidity and body mass index (BMI) of 39.0 to 39.9 in adult [facility]) Assessment & Plan: Recommend continue to work on eating healthy diet and exercise.    Lumbar back pain Assessment & Plan: Lidoderm  patches and methocarbamol .   Orders: -     Lidocaine ; Place 1 patch onto the skin daily. Remove &  Discard patch within 12 hours or as directed by MD  Dispense: 30 patch; Refill: 2 -     Methocarbamol ; Take 1 tablet (500 mg total) by mouth every 6 (six) hours as needed for muscle spasms.  Dispense: 180 tablet; Refill: 0  Attention deficit hyperactivity disorder (ADHD), predominantly inattentive type Assessment & Plan: Change vyvanse  to methylphenidate 20 mg twice daily.   Orders: -     Methylphenidate HCl; Take 1 tablet (20 mg total) by mouth 2 (two) times daily.  Dispense: 60 tablet; Refill: 0  Chronic migraine without aura without status migrainosus, not intractable Assessment & Plan: Doing well.  Continue magnesium otc.  Continue maxalt  for acute treatment.    OSA (obstructive sleep apnea) Assessment & Plan: COMPLIANT AND TOLERATING. BENEFITING FROM CPAP.    Mild recurrent major depression (HCC) Assessment & Plan: Well controlled. No medicines.      Meds ordered this encounter  Medications   DISCONTD: lidocaine  (LIDODERM ) 5 %    Sig: Place 1 patch onto the skin daily. Remove & Discard patch within 12 hours or as directed by MD    Dispense:  30 patch    Refill:  2   methylphenidate (RITALIN) 20 MG tablet    Sig: Take 1 tablet (20 mg total) by mouth 2 (two) times daily.  Dispense:  60 tablet    Refill:  0   lidocaine  (LIDODERM ) 5 %    Sig: Place 1 patch onto the skin daily. Remove & Discard patch within 12 hours or as directed by MD    Dispense:  30 patch    Refill:  2   methocarbamol  (ROBAXIN ) 500 MG tablet    Sig: Take 1 tablet (500 mg total) by mouth every 6 (six) hours as needed for muscle spasms.    Dispense:  180 tablet    Refill:  0    No orders of the defined types were placed in this encounter.    Follow-up: Return in about 3 months (around 02/05/2024) for chronic fasting.   I,Marla I Leal-Borjas,acting as a scribe for Mercy Stall, MD.,have documented all relevant documentation on the behalf of Mercy Stall, MD,as directed by  Mercy Stall, MD while  in the presence of Mercy Stall, MD.   An After Visit Summary was printed and given to the patient.  I attest that I have reviewed this visit and agree with the plan scribed by my staff.   Mercy Stall, MD [facility] [phone]

## 2023-11-08 NOTE — Assessment & Plan Note (Addendum)
 Lidoderm  patches and methocarbamol .

## 2023-11-08 NOTE — Assessment & Plan Note (Signed)
 Change vyvanse  to methylphenidate 20 mg twice daily.

## 2023-11-08 NOTE — Assessment & Plan Note (Signed)
Well controlled  No medicines. 

## 2023-11-08 NOTE — Assessment & Plan Note (Signed)
 Recommend continue to work on eating healthy diet and exercise.

## 2023-11-08 NOTE — Assessment & Plan Note (Signed)
 COMPLIANT AND TOLERATING. BENEFITING FROM CPAP.

## 2023-11-08 NOTE — Assessment & Plan Note (Signed)
 Doing well.  Continue magnesium otc.  Continue maxalt for acute treatment.

## 2023-12-06 ENCOUNTER — Telehealth: Payer: Self-pay | Admitting: Family Medicine

## 2023-12-06 NOTE — Telephone Encounter (Signed)
 [facility] Medical Necessity

## 2023-12-12 ENCOUNTER — Other Ambulatory Visit: Payer: Self-pay | Admitting: Family Medicine

## 2023-12-12 ENCOUNTER — Encounter: Payer: Self-pay | Admitting: Family Medicine

## 2023-12-12 DIAGNOSIS — F900 Attention-deficit hyperactivity disorder, predominantly inattentive type: Secondary | ICD-10-CM

## 2023-12-12 MED ORDER — METHYLPHENIDATE HCL 20 MG PO TABS: 20.0000 mg | ORAL_TABLET | Freq: Two times a day (BID) | ORAL | 0 refills | Status: DC

## 2024-01-15 ENCOUNTER — Encounter: Payer: Self-pay | Admitting: Family Medicine

## 2024-01-15 ENCOUNTER — Other Ambulatory Visit: Payer: Self-pay | Admitting: Family Medicine

## 2024-01-15 MED ORDER — LISDEXAMFETAMINE DIMESYLATE 50 MG PO CAPS: 50.0000 mg | ORAL_CAPSULE | Freq: Every day | ORAL | 0 refills | Status: DC

## 2024-02-14 ENCOUNTER — Encounter: Payer: Self-pay | Admitting: Family Medicine

## 2024-02-14 ENCOUNTER — Ambulatory Visit: Admitting: Family Medicine

## 2024-02-14 VITALS — BP 136/74 | HR 79 | Temp 98.1°F | Ht 66.0 in | Wt 246.0 lb

## 2024-02-14 DIAGNOSIS — E782 Mixed hyperlipidemia: Secondary | ICD-10-CM

## 2024-02-14 DIAGNOSIS — Z6839 Body mass index (BMI) 39.0-39.9, adult: Secondary | ICD-10-CM

## 2024-02-14 DIAGNOSIS — G43709 Chronic migraine without aura, not intractable, without status migrainosus: Secondary | ICD-10-CM

## 2024-02-14 DIAGNOSIS — F900 Attention-deficit hyperactivity disorder, predominantly inattentive type: Secondary | ICD-10-CM

## 2024-02-14 DIAGNOSIS — E66812 Obesity, class 2: Secondary | ICD-10-CM

## 2024-02-14 DIAGNOSIS — M545 Low back pain, unspecified: Secondary | ICD-10-CM | POA: Diagnosis not present

## 2024-02-14 DIAGNOSIS — G4733 Obstructive sleep apnea (adult) (pediatric): Secondary | ICD-10-CM

## 2024-02-14 DIAGNOSIS — F330 Major depressive disorder, recurrent, mild: Secondary | ICD-10-CM

## 2024-02-14 LAB — POCT LIPID PANEL
HDL: 48
LDL: 115
Non-HDL: 146
TC: 194
TRG: 155

## 2024-02-14 MED ORDER — LISDEXAMFETAMINE DIMESYLATE 50 MG PO CAPS
50.0000 mg | ORAL_CAPSULE | Freq: Every day | ORAL | 0 refills | Status: DC
Start: 2024-02-14 — End: 2024-03-19

## 2024-02-14 MED ORDER — METHOCARBAMOL 500 MG PO TABS: 500.0000 mg | ORAL_TABLET | Freq: Four times a day (QID) | ORAL | 1 refills | Status: DC | PRN

## 2024-02-14 NOTE — Patient Instructions (Signed)
  VISIT SUMMARY: Today, we discussed your cholesterol management, migraine control, attention deficit symptoms, and chronic low back pain. You are making good progress with your cholesterol through diet and exercise, and your migraines are well-managed with Maxalt . Your attention deficit symptoms are effectively controlled with Vyvanse , and your chronic low back pain is being managed with chiropractic care, methocarbamol , and Lidoderm  patches.  YOUR PLAN: LOW BACK PAIN: Chronic lower back pain managed with nerve ablations, steroid injections, and chiropractic care. Surgery suggested but not pursued. Chiropractic care is more effective than previous treatments. -Continue using methocarbamol  and Lidoderm  patch for pain management. -Continue chiropractic care.  MIGRAINE: Experiencing migraines 2-3 times a month. Maxalt  is effective and provides quick relief. -Continue using Maxalt  for migraine management.  MIXED HYPERLIPIDEMIA: LDL cholesterol elevated at 115 mg/dL, above target of less than 100 mg/dL. Previously 121 mg/dL. Managing with diet and exercise, not interested in medication currently. -Monitor cholesterol levels every 6 to 12 months. -Continue with your diet and exercise regimen. -We will discuss the potential for medication if LDL remains elevated in future visits.  ATTENTION-DEFICIT HYPERACTIVITY DISORDER: Vyvanse  helps with attention and current dosage is adequate. -Continue taking Vyvanse  as prescribed.

## 2024-02-14 NOTE — Progress Notes (Signed)
 Subjective:  Patient ID: Lauren Faulkner, female    DOB: Mar 04, 1981  Age: 43 y.o. MRN: [id number]  Chief Complaint  Patient presents with   Medical Management of Chronic Issues    HPI: Discussed the use of AI scribe software for clinical note transcription with the patient, who gave verbal consent to proceed.  History of Present Illness Lauren Faulkner is a 43 year old female who presents for follow-up on cholesterol management and migraine control.  Hyperlipidemia - LDL cholesterol currently 884 mg/dL, previously 878 mg/dL - Focusing on dietary modifications and increased physical activity - Recently joined a gym, now able to attend regularly since her child started kindergarten - Declines pharmacologic therapy for cholesterol management at this time  Migraine headaches - Experiences migraines approximately 2-3 times per month - Uses Maxalt  for acute treatment, which provides rapid symptom relief  Attention deficit symptoms - Currently taking Vyvanse  for attention issues - Finds Vyvanse  effective - No issues with binge eating  Chronic low back pain - Lower back pain managed with daily Lidoderm  patch and methocarbamol  - History of MRI and pain clinic interventions including nerve ablations and steroid injections - Finds chiropractic care more beneficial than previous treatments  Constitutional and systemic symptoms - No fever, chills, sweats, earaches, sore throat, or stuffy nose - No chest pain, abdominal pain, bowel or bladder problems - No depression, sadness, or anxiety       02/14/2024    8:38 AM 11/05/2023    1:50 PM 06/04/2023   10:01 AM 01/29/2023    2:32 PM 09/29/2022   10:33 AM  Depression screen PHQ 2/9  Decreased Interest 0 1 2 2 1   Down, Depressed, Hopeless 0 0 0 1 1  PHQ - 2 Score 0 1 2 3 2   Altered sleeping 2 0 0 1 0  Tired, decreased energy 2 3 3 3 1   Change in appetite 0 2 3 3  0  Feeling bad or failure about yourself  0 0 0 0 0  Trouble  concentrating 0 2 1 2 1   Moving slowly or fidgety/restless 0 0 0 0 0  Suicidal thoughts 0 0 0 0 0  PHQ-9 Score 4 8 9 12 4   Difficult doing work/chores Somewhat difficult Somewhat difficult Somewhat difficult Somewhat difficult Not difficult at all        11/05/2023    1:50 PM  Fall Risk   Falls in the past year? 0  Number falls in past yr: 0  Injury with Fall? 0  Risk for fall due to : No Fall Risks  Follow up Education provided    Patient Care Team: Sherre Clapper, MD as PCP - General (Family Medicine) Livingston Rigg, MD as Consulting Physician (Dermatology)   Review of Systems  All other systems reviewed and are negative.   Current Outpatient Medications on File Prior to Visit  Medication Sig Dispense Refill   cholecalciferol (VITAMIN D) 1000 units tablet Take 1,000 Units by mouth daily.     lidocaine  (LIDODERM ) 5 % Place 1 patch onto the skin daily. Remove & Discard patch within 12 hours or as directed by MD 30 patch 2   Multiple Vitamin (MULTIVITAMIN) tablet Take 1 tablet by mouth daily.     ondansetron  (ZOFRAN -ODT) 8 MG disintegrating tablet DISSOLVE ONE TABLET BY MOUTH EVERY 6 HOURS AS NEEDED 30 tablet 1   rizatriptan  (MAXALT ) 10 MG tablet Take 1 tablet (10 mg total) by mouth as needed for migraine. May repeat in 2 hours if  needed 10 tablet 2   No current facility-administered medications on file prior to visit.   Past Medical History:  Diagnosis Date   Adenomyosis    fibroadenomoa R breast   Hypothyroid    Insomnia    Interstitial cystitis    Migraines    Vitamin D deficiency    Past Surgical History:  Procedure Laterality Date   CERVICAL CONE BIOPSY      Family History  Problem Relation Age of Onset   Diabetes Mother    Hyperlipidemia Mother    Fibroids Mother    Diabetes Father    Hypertension Father    Hyperlipidemia Father    Ovarian cancer Maternal Grandmother    Stroke Maternal Grandmother    Social History   Socioeconomic History   Marital  status: Married    Spouse name: Not on file   Number of children: Not on file   Years of education: Not on file   Highest education level: Bachelor's degree (e.g., BA, AB, BS)  Occupational History   Not on file  Tobacco Use   Smoking status: Former    Current packs/day: 0.00    Types: Cigarettes    Quit date: 06/05/2002    Years since quitting: 21.7   Smokeless tobacco: Never  Vaping Use   Vaping status: Never Used  Substance and Sexual Activity   Alcohol use: Yes    Comment: occ   Drug use: No   Sexual activity: Yes    Partners: Male    Birth control/protection: I.U.D.  Other Topics Concern   Not on file  Social History Narrative   Pt is nurse, employed at [employer].  Married.  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   Caffeine  1 cup per day.     Social Drivers of Corporate investment banker Strain: Low Risk  (08/21/2023)   Overall Financial Resource Strain (CARDIA)    Difficulty of Paying Living Expenses: Not hard at all  Food Insecurity: No Food Insecurity (08/21/2023)   Hunger Vital Sign    Worried About Running Out of Food in the Last Year: Never true    Ran Out of Food in the Last Year: Never true  Transportation Needs: No Transportation Needs (08/21/2023)   PRAPARE - Administrator, Civil Service (Medical): No    Lack of Transportation (Non-Medical): No  Physical Activity: Insufficiently Active (08/21/2023)   Exercise Vital Sign    Days of Exercise per Week: 2 days    Minutes of Exercise per Session: 30 min  Stress: No Stress Concern Present (08/21/2023)   [employer] of Occupational Health - Occupational Stress Questionnaire    Feeling of Stress : Not at all  Social Connections: Moderately Isolated (08/21/2023)   Social Connection and Isolation Panel    Frequency of Communication with Friends and Family: Twice a week    Frequency of Social Gatherings with Friends and Family: Once a week    Attends Religious Services: Never    Programmer, systems: No    Attends Engineer, structural: Not on file    Marital Status: Living with partner    Objective:  BP 136/74   Pulse 79   Temp 98.1 F (36.7 C)   Ht 5' 6 (1.676 m)   Wt 246 lb (111.6 kg)   SpO2 98%   BMI 39.71 kg/m      02/14/2024    8:29 AM 11/05/2023    1:38 PM 06/04/2023  9:59 AM  BP/Weight  Systolic BP 136 140 126  Diastolic BP 74 70 78  Wt. (Lbs) 246 243 242  BMI 39.71 kg/m2 39.22 kg/m2 39.06 kg/m2    Physical Exam Vitals reviewed.  Constitutional:      Appearance: Normal appearance. She is obese.  Neck:     Vascular: No carotid bruit.  Cardiovascular:     Rate and Rhythm: Normal rate and regular rhythm.     Heart sounds: Normal heart sounds.  Pulmonary:     Effort: Pulmonary effort is normal. No respiratory distress.     Breath sounds: Normal breath sounds.  Abdominal:     General: Abdomen is flat. Bowel sounds are normal.     Palpations: Abdomen is soft.     Tenderness: There is no abdominal tenderness.  Neurological:     Mental Status: She is alert and oriented to person, place, and time.  Psychiatric:        Mood and Affect: Mood normal.        Behavior: Behavior normal.         Lab Results  Component Value Date   WBC 9.1 06/04/2023   HGB 12.1 06/04/2023   HCT 37.6 06/04/2023   PLT 335 06/04/2023   GLUCOSE 93 06/04/2023   CHOL 212 (H) 06/04/2023   TRIG 140 06/04/2023   HDL 67 06/04/2023   LDLCALC 121 (H) 06/04/2023   ALT 33 (H) 06/04/2023   AST 20 06/04/2023   NA 140 06/04/2023   K 4.3 06/04/2023   CL 103 06/04/2023   CREATININE 0.83 06/04/2023   BUN 24 06/04/2023   CO2 22 06/04/2023   TSH 1.100 01/29/2023   HGBA1C 5.5 08/10/2021       Component Ref Range & Units (hover) 09:04 (02/14/24)  TC 194  HDL 48  TRG 155  LDL 115  Non-HDL 146      Assessment & Plan:  Lumbar back pain Assessment & Plan: Low back pain, unspecified Chronic lower back pain managed with nerve ablations, steroid  injections, and chiropractic care. Surgery suggested but not pursued. Chiropractic care is more effective than previous treatments. - Continue methocarbamol  and Lidoderm  patch for pain management. - Continue chiropractic care.  Orders: -     Methocarbamol ; Take 1 tablet (500 mg total) by mouth every 6 (six) hours as needed for muscle spasms.  Dispense: 180 tablet; Refill: 1  Attention deficit hyperactivity disorder (ADHD), predominantly inattentive type Assessment & Plan: Vyvanse  helps with attention and current dosage is adequate. - Continue Vyvanse  prescription.  Orders: -     Lisdexamfetamine Dimesylate ; Take 1 capsule (50 mg total) by mouth daily.  Dispense: 30 capsule; Refill: 0  Mixed hyperlipidemia Assessment & Plan: LDL cholesterol elevated at 115 mg/dL, above target of less than 100 mg/dL. Previously 121 mg/dL. Managing with diet and exercise, not interested in medication currently. - Monitor cholesterol levels every 6 to 12 months. - Encourage continuation of diet and exercise regimen. - Discuss potential for medication if LDL remains elevated in future visits.  Orders: -     POCT Lipid Panel  Chronic migraine without aura without status migrainosus, not intractable Assessment & Plan: Experiencing migraines 2-3 times a month. Maxalt  is effective and provides quick relief. - Continue Maxalt  for migraine management.   Class 2 severe obesity due to excess calories with serious comorbidity and body mass index (BMI) of 39.0 to 39.9 in adult [facility]) Assessment & Plan: Recommend continue to work on eating healthy diet and  exercise.    OSA (obstructive sleep apnea) Assessment & Plan: COMPLIANT AND TOLERATING. BENEFITING FROM CPAP.  CONSIDER ZEPBOUND  IN FUTURE.      Meds ordered this encounter  Medications   lisdexamfetamine (VYVANSE ) 50 MG capsule    Sig: Take 1 capsule (50 mg total) by mouth daily.    Dispense:  30 capsule    Refill:  0   methocarbamol  (ROBAXIN ) 500  MG tablet    Sig: Take 1 tablet (500 mg total) by mouth every 6 (six) hours as needed for muscle spasms.    Dispense:  180 tablet    Refill:  1    Orders Placed This Encounter  Procedures   POCT Lipid Panel     Follow-up: Return in about 6 months (around 08/13/2024) for cpe.   I,Marla I Leal-Borjas,acting as a scribe for Abigail Free, MD.,have documented all relevant documentation on the behalf of Abigail Free, MD,as directed by  Abigail Free, MD while in the presence of Abigail Free, MD.    An After Visit Summary was printed and given to the patient.  I attest that I have reviewed this visit and agree with the plan scribed by my staff.   Abigail Free, MD [facility] [phone]

## 2024-02-23 NOTE — Assessment & Plan Note (Signed)
 LDL cholesterol elevated at 115 mg/dL, above target of less than 100 mg/dL. Previously 121 mg/dL. Managing with diet and exercise, not interested in medication currently. - Monitor cholesterol levels every 6 to 12 months. - Encourage continuation of diet and exercise regimen. - Discuss potential for medication if LDL remains elevated in future visits.

## 2024-02-23 NOTE — Assessment & Plan Note (Signed)
 Experiencing migraines 2-3 times a month. Maxalt  is effective and provides quick relief. - Continue Maxalt  for migraine management.

## 2024-02-23 NOTE — Assessment & Plan Note (Signed)
 Low back pain, unspecified Chronic lower back pain managed with nerve ablations, steroid injections, and chiropractic care. Surgery suggested but not pursued. Chiropractic care is more effective than previous treatments. - Continue methocarbamol  and Lidoderm  patch for pain management. - Continue chiropractic care.

## 2024-02-23 NOTE — Assessment & Plan Note (Signed)
 Recommend continue to work on eating healthy diet and exercise.

## 2024-02-23 NOTE — Assessment & Plan Note (Signed)
 Vyvanse  helps with attention and current dosage is adequate. - Continue Vyvanse  prescription.

## 2024-02-23 NOTE — Assessment & Plan Note (Signed)
 COMPLIANT AND TOLERATING. BENEFITING FROM CPAP.  CONSIDER ZEPBOUND  IN FUTURE.

## 2024-03-13 ENCOUNTER — Other Ambulatory Visit: Payer: Self-pay | Admitting: Family Medicine

## 2024-03-13 DIAGNOSIS — M545 Low back pain, unspecified: Secondary | ICD-10-CM

## 2024-03-19 ENCOUNTER — Other Ambulatory Visit: Payer: Self-pay

## 2024-03-19 ENCOUNTER — Encounter: Payer: Self-pay | Admitting: Family Medicine

## 2024-03-19 DIAGNOSIS — F900 Attention-deficit hyperactivity disorder, predominantly inattentive type: Secondary | ICD-10-CM

## 2024-03-19 MED ORDER — LISDEXAMFETAMINE DIMESYLATE 50 MG PO CAPS: 50.0000 mg | ORAL_CAPSULE | Freq: Every day | ORAL | 0 refills | Status: DC

## 2024-03-26 ENCOUNTER — Other Ambulatory Visit: Payer: Self-pay | Admitting: Family Medicine

## 2024-03-26 DIAGNOSIS — F900 Attention-deficit hyperactivity disorder, predominantly inattentive type: Secondary | ICD-10-CM

## 2024-03-27 ENCOUNTER — Other Ambulatory Visit: Payer: Self-pay | Admitting: Family Medicine

## 2024-03-27 DIAGNOSIS — F900 Attention-deficit hyperactivity disorder, predominantly inattentive type: Secondary | ICD-10-CM

## 2024-03-27 MED ORDER — LISDEXAMFETAMINE DIMESYLATE 50 MG PO CAPS: 50.0000 mg | ORAL_CAPSULE | Freq: Every day | ORAL | 0 refills | Status: DC

## 2024-04-21 ENCOUNTER — Encounter: Payer: Self-pay | Admitting: Family Medicine

## 2024-04-21 ENCOUNTER — Other Ambulatory Visit: Payer: Self-pay | Admitting: Family Medicine

## 2024-04-21 DIAGNOSIS — F900 Attention-deficit hyperactivity disorder, predominantly inattentive type: Secondary | ICD-10-CM

## 2024-04-21 DIAGNOSIS — M545 Low back pain, unspecified: Secondary | ICD-10-CM

## 2024-04-21 MED ORDER — METHOCARBAMOL 500 MG PO TABS: 500.0000 mg | ORAL_TABLET | Freq: Four times a day (QID) | ORAL | 1 refills | Status: AC | PRN

## 2024-06-26 ENCOUNTER — Other Ambulatory Visit: Payer: Self-pay | Admitting: Family Medicine

## 2024-06-26 ENCOUNTER — Encounter: Payer: Self-pay | Admitting: Family Medicine

## 2024-06-26 DIAGNOSIS — F900 Attention-deficit hyperactivity disorder, predominantly inattentive type: Secondary | ICD-10-CM

## 2024-06-26 MED ORDER — LISDEXAMFETAMINE DIMESYLATE 50 MG PO CAPS: 50.0000 mg | ORAL_CAPSULE | Freq: Every day | ORAL | 0 refills | Status: DC

## 2024-06-27 ENCOUNTER — Other Ambulatory Visit: Payer: Self-pay

## 2024-06-27 DIAGNOSIS — F900 Attention-deficit hyperactivity disorder, predominantly inattentive type: Secondary | ICD-10-CM

## 2024-06-27 MED ORDER — LISDEXAMFETAMINE DIMESYLATE 50 MG PO CAPS: 50.0000 mg | ORAL_CAPSULE | Freq: Every day | ORAL | 0 refills | Status: AC

## 2024-07-04 ENCOUNTER — Encounter: Payer: Self-pay | Admitting: Family Medicine

## 2024-07-14 ENCOUNTER — Ambulatory Visit: Payer: Self-pay | Admitting: Family Medicine

## 2024-07-14 ENCOUNTER — Ambulatory Visit: Payer: Self-pay | Admitting: Physician Assistant

## 2024-08-05 ENCOUNTER — Ambulatory Visit: Payer: Self-pay | Admitting: Family Medicine

## 2024-08-13 ENCOUNTER — Encounter: Admitting: Family Medicine

## 2024-09-09 ENCOUNTER — Encounter: Admitting: Family Medicine
